# Patient Record
Sex: Male | Born: 1970 | Race: White | Hispanic: No | Marital: Married | Smoking: Never smoker
Health system: Southern US, Community
[De-identification: ages and names within clinical notes are randomized; demographics above are authoritative.]

## PROBLEM LIST (undated history)

## (undated) ENCOUNTER — Ambulatory Visit: Admission: EM | Payer: 59 | Source: Home / Self Care

## (undated) HISTORY — PX: SHOULDER ARTHROSCOPY: SHX128

---

## 2018-03-04 ENCOUNTER — Ambulatory Visit (INDEPENDENT_AMBULATORY_CARE_PROVIDER_SITE_OTHER): Payer: Self-pay | Admitting: Orthopaedic Surgery

## 2018-03-12 ENCOUNTER — Encounter (INDEPENDENT_AMBULATORY_CARE_PROVIDER_SITE_OTHER): Payer: Self-pay | Admitting: Orthopaedic Surgery

## 2018-03-12 ENCOUNTER — Ambulatory Visit (INDEPENDENT_AMBULATORY_CARE_PROVIDER_SITE_OTHER): Payer: Worker's Compensation | Admitting: Orthopaedic Surgery

## 2018-03-12 ENCOUNTER — Ambulatory Visit (INDEPENDENT_AMBULATORY_CARE_PROVIDER_SITE_OTHER): Payer: Self-pay

## 2018-03-12 VITALS — BP 133/97 | HR 89 | Ht 72.0 in | Wt 275.0 lb

## 2018-03-12 DIAGNOSIS — M25531 Pain in right wrist: Secondary | ICD-10-CM

## 2018-03-12 NOTE — Progress Notes (Signed)
   Office Visit Note   Patient: Russell Duarte.           Date of Birth: Dec 01, 1970           MRN: [id number] Visit Date: 03/12/2018              Requested by: No referring provider defined for this encounter. PCP: No primary care provider on file.   Assessment & Plan: Visit Diagnoses:  1. Pain in right wrist     Plan: Thumb spica splint right wrist.  MR arthrogram right wrist.  Return after the above study  Follow-Up Instructions: No follow-ups on file.   Orders:  Orders Placed This Encounter  Procedures  . XR Wrist Complete Left   No orders of the defined types were placed in this encounter.     Procedures: No procedures performed   Clinical Data: No additional findings.   Subjective: No chief complaint on file. On-the-job injury on August 16.  Larey Seat down an embankment and tried to break his fall with an outstretched right upper extremity.  Had some discomfort initially that he thought was simply a "sprain".  Seen approximately a week later in the [facility] emergency room with films demonstrating possible separation of the scapholunate articulation.  In a splint and asked to follow-up with his orthopedist.  Has been able to work in the splint but "awkward".  No related numbness or tingling. I did review the films on the PACS system.  There is a separation between the scaphoid and the lunate but no other obvious abnormalities.  HPI  Review of Systems   Objective: Vital Signs: There were no vitals taken for this visit.  Physical Exam  Constitutional: He is oriented to person, place, and time. He appears well-developed and well-nourished.  HENT:  Mouth/Throat: Oropharynx is clear and moist.  Eyes: Pupils are equal, round, and reactive to light. EOM are normal.  Pulmonary/Chest: Effort normal.  Neurological: He is alert and oriented to person, place, and time.  Skin: Skin is warm and dry.  Psychiatric: He has a normal mood and affect. His behavior is normal.     Ortho Exam splint right wrist was removed.  No ecchymosis.  No edema.  Neurovascular exam intact.  Able to make a full fist.  Does have some tenderness in the area of the flexor carpi radialis tendon at the level of the wrist and even the snuffbox.  Some tenderness in the area of the scapholunate articulation.  No pain or distal radius or ulna.  No pain in the dorsum of his wrist other than above.  No digit swelling.  Neurovascular exam intact Specialty Comments:  No specialty comments available.  Imaging: No results found.   PMFS History: There are no active problems to display for this patient.  No past medical history on file.  No family history on file.   Social History   Occupational History  . Not on file  Tobacco Use  . Smoking status: Not on file  Substance and Sexual Activity  . Alcohol use: Not on file  . Drug use: Not on file  . Sexual activity: Not on file     Valeria Batman, MD   Note - This record has been created using [employer].  Chart creation errors have been sought, but may not always  have been located. Such creation errors do not reflect on  the standard of medical care.

## 2018-03-27 ENCOUNTER — Telehealth (INDEPENDENT_AMBULATORY_CARE_PROVIDER_SITE_OTHER): Payer: Self-pay

## 2018-03-27 NOTE — Telephone Encounter (Signed)
fyi-I got notification from [facility] that pt is scheduled for MRI [facility] on 04/02/[facility]. Seen in [place] so will need f/u appt there. Thanks

## 2018-03-28 NOTE — Telephone Encounter (Signed)
Notified Russell Duarte in [place] to set up follow appt in [place]

## 2018-04-02 ENCOUNTER — Encounter: Payer: Self-pay | Admitting: Orthopaedic Surgery

## 2018-04-09 ENCOUNTER — Other Ambulatory Visit (INDEPENDENT_AMBULATORY_CARE_PROVIDER_SITE_OTHER): Payer: Self-pay | Admitting: Radiology

## 2018-04-09 ENCOUNTER — Encounter (INDEPENDENT_AMBULATORY_CARE_PROVIDER_SITE_OTHER): Payer: Self-pay | Admitting: Orthopaedic Surgery

## 2018-04-09 ENCOUNTER — Ambulatory Visit (INDEPENDENT_AMBULATORY_CARE_PROVIDER_SITE_OTHER): Payer: Worker's Compensation | Admitting: Orthopaedic Surgery

## 2018-04-09 DIAGNOSIS — M79641 Pain in right hand: Secondary | ICD-10-CM

## 2018-04-09 NOTE — Progress Notes (Signed)
   Office Visit Note   Patient: Russell Duarte.           Date of Birth: 1971/01/15           MRN: [id number] Visit Date: 04/09/2018              Requested by: No referring provider defined for this encounter. PCP: Patient, No Pcp Per   Assessment & Plan: Visit Diagnoses:  1. Pain of right hand     Plan: MRI arthrogram of right wrist demonstrates a tear of the scapholunate ligament allowing contrast to extend into the mid carpal row.  No additional significant internal derangement is identified. Russell Duarte and his wrist while on the job about 7 weeks ago.  He is been wearing a volar wrist splint but still having some pain.  Given all of the above I think it is worth having 1 of the hand surgeons evaluate him for consideration of surgical intervention.  We will make the referral after approval from [employer].  Follow-Up Instructions: Return will refer to Dr Merlyn Lot.   Orders:  No orders of the defined types were placed in this encounter.  No orders of the defined types were placed in this encounter.     Procedures: No procedures performed   Clinical Data: No additional findings.   Subjective: No chief complaint on file. 7-week history of right wrist pain as previously identified with an on-the-job injury.  MRI arthrogram obtained last week and here for the results.  HPI  Review of Systems   Objective: Vital Signs: There were no vitals taken for this visit.  Physical Exam  Ortho Exam limited exam of right wrist out of the splint.  Still having some tenderness dorsally and at the radiocarpal joint in the area of the scapholunate ligament.  Prior x-rays reveal widening.  Skin intact.  Neurovascular exam intact  Specialty Comments:  No specialty comments available.  Imaging: No results found.   PMFS History: There are no active problems to display for this patient.  History reviewed. No pertinent past medical history.  History reviewed. No pertinent  family history.  Past Surgical History:  Procedure Laterality Date  . SHOULDER ARTHROSCOPY     Social History   Occupational History  . Not on file  Tobacco Use  . Smoking status: Never Smoker  . Smokeless tobacco: Never Used  Substance and Sexual Activity  . Alcohol use: Not Currently  . Drug use: Not Currently  . Sexual activity: Not on file     Valeria Batman, MD   Note - This record has been created using [employer].  Chart creation errors have been sought, but may not always  have been located. Such creation errors do not reflect on  the standard of medical care.

## 2018-06-11 ENCOUNTER — Other Ambulatory Visit: Payer: Self-pay | Admitting: Orthopedic Surgery

## 2018-07-14 ENCOUNTER — Other Ambulatory Visit: Payer: Self-pay

## 2018-07-14 ENCOUNTER — Encounter (HOSPITAL_BASED_OUTPATIENT_CLINIC_OR_DEPARTMENT_OTHER): Payer: Self-pay | Admitting: *Deleted

## 2018-07-22 ENCOUNTER — Encounter (HOSPITAL_BASED_OUTPATIENT_CLINIC_OR_DEPARTMENT_OTHER)
Admission: RE | Disposition: A | Discharge status: Home / Self Care | Payer: Self-pay | Source: Ambulatory Visit | Attending: Orthopedic Surgery

## 2018-07-22 ENCOUNTER — Other Ambulatory Visit: Payer: Self-pay

## 2018-07-22 ENCOUNTER — Ambulatory Visit (HOSPITAL_BASED_OUTPATIENT_CLINIC_OR_DEPARTMENT_OTHER): Payer: No Typology Code available for payment source | Admitting: Anesthesiology

## 2018-07-22 ENCOUNTER — Encounter (HOSPITAL_BASED_OUTPATIENT_CLINIC_OR_DEPARTMENT_OTHER): Payer: Self-pay

## 2018-07-22 ENCOUNTER — Ambulatory Visit (HOSPITAL_BASED_OUTPATIENT_CLINIC_OR_DEPARTMENT_OTHER)
Admission: RE | Admit: 2018-07-22 | Discharge: 2018-07-22 | Disposition: A | Discharge status: Home / Self Care | Payer: No Typology Code available for payment source | Source: Ambulatory Visit | Attending: Orthopedic Surgery | Admitting: Orthopedic Surgery

## 2018-07-22 DIAGNOSIS — Y929 Unspecified place or not applicable: Secondary | ICD-10-CM | POA: Diagnosis not present

## 2018-07-22 DIAGNOSIS — W010XXA Fall on same level from slipping, tripping and stumbling without subsequent striking against object, initial encounter: Secondary | ICD-10-CM | POA: Insufficient documentation

## 2018-07-22 DIAGNOSIS — S63591A Other specified sprain of right wrist, initial encounter: Secondary | ICD-10-CM | POA: Insufficient documentation

## 2018-07-22 HISTORY — PX: WRIST ARTHROSCOPY: SHX838

## 2018-07-22 SURGERY — ARTHROSCOPY, WRIST
Anesthesia: Regional | Site: Wrist | Laterality: Right

## 2018-07-22 MED ORDER — ONDANSETRON HCL 4 MG/2ML IJ SOLN
INTRAMUSCULAR | Status: DC | PRN
  Administered 2018-07-22: 4 mg via INTRAVENOUS

## 2018-07-22 MED ORDER — FENTANYL CITRATE (PF) 100 MCG/2ML IJ SOLN
INTRAMUSCULAR | Status: AC
  Filled 2018-07-22: qty 2

## 2018-07-22 MED ORDER — SCOPOLAMINE 1 MG/3DAYS TD PT72: 1.0000 | MEDICATED_PATCH | Freq: Once | TRANSDERMAL | Status: DC | PRN

## 2018-07-22 MED ORDER — OXYCODONE HCL 5 MG/5ML PO SOLN: 5.0000 mg | Freq: Once | ORAL | Status: DC | PRN

## 2018-07-22 MED ORDER — OXYCODONE HCL 5 MG PO TABS: 5.0000 mg | ORAL_TABLET | Freq: Once | ORAL | Status: DC | PRN

## 2018-07-22 MED ORDER — MEPERIDINE HCL 25 MG/ML IJ SOLN: 6.2500 mg | INTRAMUSCULAR | Status: DC | PRN

## 2018-07-22 MED ORDER — MIDAZOLAM HCL 2 MG/2ML IJ SOLN
1.0000 mg | INTRAMUSCULAR | Status: DC | PRN
  Administered 2018-07-22: 2 mg via INTRAVENOUS

## 2018-07-22 MED ORDER — PROPOFOL 500 MG/50ML IV EMUL
INTRAVENOUS | Status: DC | PRN
  Administered 2018-07-22: 100 ug/kg/min via INTRAVENOUS

## 2018-07-22 MED ORDER — LACTATED RINGERS IV SOLN
INTRAVENOUS | Status: DC
  Administered 2018-07-22 (×2): via INTRAVENOUS

## 2018-07-22 MED ORDER — PROMETHAZINE HCL 25 MG/ML IJ SOLN: 6.2500 mg | INTRAMUSCULAR | Status: DC | PRN

## 2018-07-22 MED ORDER — CEFAZOLIN SODIUM-DEXTROSE 2-4 GM/100ML-% IV SOLN
2.0000 g | INTRAVENOUS | Status: AC
  Administered 2018-07-22: 2 g via INTRAVENOUS

## 2018-07-22 MED ORDER — CEFAZOLIN SODIUM-DEXTROSE 2-4 GM/100ML-% IV SOLN
INTRAVENOUS | Status: AC
  Filled 2018-07-22: qty 100

## 2018-07-22 MED ORDER — ROPIVACAINE HCL 7.5 MG/ML IJ SOLN
INTRAMUSCULAR | Status: DC | PRN
  Administered 2018-07-22: 40 mL via PERINEURAL

## 2018-07-22 MED ORDER — FENTANYL CITRATE (PF) 100 MCG/2ML IJ SOLN
50.0000 ug | INTRAMUSCULAR | Status: DC | PRN
  Administered 2018-07-22: 50 ug via INTRAVENOUS

## 2018-07-22 MED ORDER — ONDANSETRON HCL 4 MG/2ML IJ SOLN
INTRAMUSCULAR | Status: AC
  Filled 2018-07-22: qty 2

## 2018-07-22 MED ORDER — TRAMADOL HCL 50 MG PO TABS: 50.0000 mg | ORAL_TABLET | Freq: Four times a day (QID) | ORAL | 0 refills | Status: DC | PRN

## 2018-07-22 MED ORDER — MIDAZOLAM HCL 2 MG/2ML IJ SOLN
INTRAMUSCULAR | Status: AC
  Filled 2018-07-22: qty 2

## 2018-07-22 MED ORDER — FENTANYL CITRATE (PF) 100 MCG/2ML IJ SOLN: 25.0000 ug | INTRAMUSCULAR | Status: DC | PRN

## 2018-07-22 MED ORDER — CHLORHEXIDINE GLUCONATE 4 % EX LIQD: 60.0000 mL | Freq: Once | CUTANEOUS | Status: DC

## 2018-07-22 SURGICAL SUPPLY — 71 items
BLADE CUDA 2.0 (BLADE) IMPLANT
BLADE EAR TYMPAN 2.5 60D BEAV (BLADE) IMPLANT
BLADE MINI RND TIP GREEN BEAV (BLADE) IMPLANT
BLADE SURG 15 STRL LF DISP TIS (BLADE) ×1 IMPLANT
BLADE SURG 15 STRL SS (BLADE) ×2
BNDG CMPR 9X4 STRL LF SNTH (GAUZE/BANDAGES/DRESSINGS) ×1
BNDG COHESIVE 3X5 TAN STRL LF (GAUZE/BANDAGES/DRESSINGS) ×2 IMPLANT
BNDG ESMARK 4X9 LF (GAUZE/BANDAGES/DRESSINGS) ×1 IMPLANT
BNDG GAUZE ELAST 4 BULKY (GAUZE/BANDAGES/DRESSINGS) ×2 IMPLANT
BUR CUDA 2.9 (BURR) IMPLANT
BUR FULL RADIUS 2.0 (BURR) IMPLANT
BUR FULL RADIUS 2.9 (BURR) ×1 IMPLANT
BUR GATOR 2.9 (BURR) IMPLANT
BUR SPHERICAL 2.9 (BURR) IMPLANT
CANISTER SUCT 1200ML W/VALVE (MISCELLANEOUS) IMPLANT
CHLORAPREP W/TINT 26ML (MISCELLANEOUS) ×2 IMPLANT
CORD BIPOLAR FORCEPS 12FT (ELECTRODE) IMPLANT
COVER BACK TABLE 60X90IN (DRAPES) ×2 IMPLANT
COVER MAYO STAND STRL (DRAPES) ×2 IMPLANT
COVER WAND RF STERILE (DRAPES) IMPLANT
CUFF TOURNIQUET SINGLE 18IN (TOURNIQUET CUFF) IMPLANT
DRAPE EXTREMITY T 121X128X90 (DISPOSABLE) ×2 IMPLANT
DRAPE IMP U-DRAPE 54X76 (DRAPES) ×2 IMPLANT
DRAPE OEC MINIVIEW 54X84 (DRAPES) IMPLANT
DRAPE SURG 17X23 STRL (DRAPES) ×2 IMPLANT
ELECT SMALL JOINT 90D BASC (ELECTRODE) IMPLANT
GAUZE SPONGE 4X4 12PLY STRL (GAUZE/BANDAGES/DRESSINGS) ×2 IMPLANT
GAUZE XEROFORM 1X8 LF (GAUZE/BANDAGES/DRESSINGS) ×2 IMPLANT
GLOVE BIOGEL PI IND STRL 8.5 (GLOVE) ×1 IMPLANT
GLOVE BIOGEL PI INDICATOR 8.5 (GLOVE) ×1
GLOVE SURG ORTHO 8.0 STRL STRW (GLOVE) ×2 IMPLANT
GOWN STRL REUS W/ TWL LRG LVL3 (GOWN DISPOSABLE) ×1 IMPLANT
GOWN STRL REUS W/TWL LRG LVL3 (GOWN DISPOSABLE) ×2
GOWN STRL REUS W/TWL XL LVL3 (GOWN DISPOSABLE) ×2 IMPLANT
IV NS IRRIG 3000ML ARTHROMATIC (IV SOLUTION) ×2 IMPLANT
IV SET EXT 30 76VOL 4 MALE LL (IV SETS) ×2 IMPLANT
NDL EPIDURAL TUOHY 20GX3.5 (NEEDLE) IMPLANT
NDL SAFETY ECLIPSE 18X1.5 (NEEDLE) ×3 IMPLANT
NDL SPNL 18GX3.5 QUINCKE PK (NEEDLE) IMPLANT
NEEDLE HYPO 18GX1.5 SHARP (NEEDLE) ×4
NEEDLE HYPO 22GX1.5 SAFETY (NEEDLE) ×2 IMPLANT
NEEDLE SPNL 18GX3.5 QUINCKE PK (NEEDLE) IMPLANT
NEEDLE TUOHY 20GX3.5 (NEEDLE) IMPLANT
NS IRRIG 1000ML POUR BTL (IV SOLUTION) IMPLANT
PACK BASIN DAY SURGERY FS (CUSTOM PROCEDURE TRAY) ×2 IMPLANT
PAD CAST 3X4 CTTN HI CHSV (CAST SUPPLIES) ×1 IMPLANT
PADDING CAST ABS 3INX4YD NS (CAST SUPPLIES) ×1
PADDING CAST ABS 4INX4YD NS (CAST SUPPLIES) ×1
PADDING CAST ABS COTTON 3X4 (CAST SUPPLIES) ×1 IMPLANT
PADDING CAST ABS COTTON 4X4 ST (CAST SUPPLIES) ×1 IMPLANT
PADDING CAST COTTON 3X4 STRL (CAST SUPPLIES) ×2
ROUTER HOODED VORTEX 2.9MM (BLADE) IMPLANT
SET SM JOINT TUBING/CANN (CANNULA) IMPLANT
SLEEVE SCD COMPRESS KNEE MED (MISCELLANEOUS) IMPLANT
SPLINT PLASTER CAST XFAST 3X15 (CAST SUPPLIES) IMPLANT
SPLINT PLASTER XTRA FASTSET 3X (CAST SUPPLIES)
STOCKINETTE 4X48 STRL (DRAPES) ×2 IMPLANT
SUCTION FRAZIER HANDLE 10FR (MISCELLANEOUS)
SUCTION TUBE FRAZIER 10FR DISP (MISCELLANEOUS) IMPLANT
SUT MERSILENE 4 0 P 3 (SUTURE) IMPLANT
SUT PDS AB 2-0 CT2 27 (SUTURE) IMPLANT
SUT STEEL 4 0 (SUTURE) IMPLANT
SUT VIC AB 2-0 PS2 27 (SUTURE) IMPLANT
SUT VICRYL 4-0 PS2 18IN ABS (SUTURE) IMPLANT
SYR BULB 3OZ (MISCELLANEOUS) ×2 IMPLANT
SYR CONTROL 10ML LL (SYRINGE) ×2 IMPLANT
TUBE CONNECTING 20X1/4 (TUBING) ×1 IMPLANT
TUBING ARTHRO INFLOW-ONLY STRL (TUBING) IMPLANT
UNDERPAD 30X30 (UNDERPADS AND DIAPERS) ×2 IMPLANT
WAND SHORT BEVEL W/CORD (SURGICAL WAND) ×1 IMPLANT
WATER STERILE IRR 1000ML POUR (IV SOLUTION) ×2 IMPLANT

## 2018-07-22 NOTE — Brief Op Note (Signed)
07/22/2018  11:35 AM  PATIENT:  Kem Kays.  48 y.o. male  PRE-OPERATIVE DIAGNOSIS:  SCAPHO LUNATE TEAR RIGHT WRIST  POST-OPERATIVE DIAGNOSIS:  * No post-op diagnosis entered *  PROCEDURE:  Procedure(s) with comments: RIGHT ARTHROSCOPY WRIST DEBRIDEMENT POSSIBLE SHRINKAGE (Right) - AXILLLARY BLOCK  SURGEON:  Surgeon(s) and Role:    * Cindee Salt, MD - Primary  PHYSICIAN ASSISTANT:   ASSISTANTS: none   ANESTHESIA:   regional and IV sedation  EBL: 61ml  BLOOD ADMINISTERED:none  DRAINS: none   LOCAL MEDICATIONS USED:  NONE  SPECIMEN:  No Specimen  DISPOSITION OF SPECIMEN:  N/A  COUNTS:  YES  TOURNIQUET:   Total Tourniquet Time Documented: Upper Arm (Right) - 22 minutes Total: Upper Arm (Right) - 22 minutes   DICTATION: .Reubin Milan Dictation  PLAN OF CARE: Discharge to home after PACU  PATIENT DISPOSITION:  PACU - hemodynamically stable.

## 2018-07-22 NOTE — Discharge Instructions (Addendum)

## 2018-07-22 NOTE — Anesthesia Procedure Notes (Signed)
Anesthesia Regional Block: Axillary brachial plexus block   Pre-Anesthetic Checklist: ,, timeout performed, Correct Patient, Correct Site, Correct Laterality, Correct Procedure, Correct Position, site marked, Risks and benefits discussed,  Surgical consent,  Pre-op evaluation,  At surgeon's request and post-op pain management  Laterality: Right  Prep: chloraprep       Needles:  Injection technique: Single-shot  Needle Type: Stimiplex          Additional Needles:   Procedures:,,,, ultrasound used (permanent image in chart),,,,  Narrative:  Start time: 07/22/2018 10:27 AM End time: 07/22/2018 10:32 AM  Performed by: Personally  Anesthesiologist: Lewie Loron, MD  Additional Notes: Patient tolerated the procedure well without complications

## 2018-07-22 NOTE — H&P (Signed)
  Russell Duarte. is an 48 y.o. male.   Chief Complaint: right wrist painHPI: Russell Duarte is a 48 year old right-hand-dominant male referred by Dr. Cleophas Dunker for consultation regarding an injury he sustained to his wrist when he slipped and fell onto his outstretched hand. The injury occurred on February 17, 2018. He was originally told he might have a fractured scaphoid was subsequently referred to Dr. Cleophas Dunker who felt he may have no fracture and a ligament injury. He ordered an MRI arthrogram which was done revealing a tear of the scapholunate ligament of his right wrist. He complains of a toothache type pain especially with a cold or doing push-ups with a VAS score of 7-8 over 10. 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 Is been taking Aleve. He has no prior history of injury. He has no history of diabetes thyroid problems arthritis or gout. His family history is positive diabetes arthritis negative for thyroid problems and gout. We discussed the possibility of arthroscopic inspection versus exploration for possible repair of his scapholunate ligament injury right wrist. He has had an MRI done revealing a scapholunate ligament tear   History reviewed. No pertinent past medical history.  Past Surgical History:  Procedure Laterality Date  . SHOULDER ARTHROSCOPY      History reviewed. No pertinent family history. Social History:  reports that he has never smoked. He has never used smokeless tobacco. He reports that he does not drink alcohol or use drugs.  Allergies: No Known Allergies  No medications prior to admission.    No results found for this or any previous visit (from the past 48 hour(s)).  No results found.   Pertinent items are noted in HPI.  Height 6' (1.829 m), weight 124.7 kg.  General appearance: alert, cooperative and appears stated age Head: Normocephalic, without obvious abnormality Neck: no JVD Resp: clear to  auscultation bilaterally Cardio: regular rate and rhythm, S1, S2 normal, no murmur, click, rub or gallop GI: soft, non-tender; bowel sounds normal; no masses,  no organomegaly Extremities: right wrist pain Pulses: 2+ and symmetric Skin: Skin color, texture, turgor normal. No rashes or lesions Neurologic: Grossly normal Incision/Wound: na  Assessment/Plan  Assessment:  1. Tear of right scapholunate ligament  Plan: He has decided he would like to proceed proceed with arthroscopic inspection debridement possible shrinkage of possible in the effort to evaluate the total extent of the tear injury. This will be scheduled as an outpatient under regional anesthesia anesthesia. He is advised to that this may not resolve all of his symptoms for him. But will give [place] a better idea of what and we may need to be done more definitively if there is a complete tear. It would be up to him to decide whether he would like to have this surgically reconstructed. I will see him back following the surgery. This to his right wrist which can be done as an outpatient under regional anesthesia. His one-handed work  Cindee Salt 07/22/2018, 9:58 AM

## 2018-07-22 NOTE — Progress Notes (Signed)
Assisted Dr. Renold Don with right, interscalene , axillary block. Side rails up, monitors on throughout procedure. See vital signs in flow sheet. Tolerated Procedure well.

## 2018-07-22 NOTE — Transfer of Care (Signed)
Immediate Anesthesia Transfer of Care Note  Patient: Russell Duarte.  Procedure(s) Performed: RIGHT ARTHROSCOPY WRIST DEBRIDEMENT POSSIBLE SHRINKAGE (Right Wrist)  Patient Location: PACU  Anesthesia Type:MAC combined with regional for post-op pain  Level of Consciousness: awake, alert  and oriented  Airway & Oxygen Therapy: Patient Spontanous Breathing and Patient connected to face mask oxygen  Post-op Assessment: Report given to RN and Post -op Vital signs reviewed and stable  Post vital signs: Reviewed and stable  Last Vitals:  Vitals Value Taken Time  BP    Temp    Pulse 82 07/22/2018 11:38 AM  Resp 15 07/22/2018 11:38 AM  SpO2 95 % 07/22/2018 11:38 AM  Vitals shown include unvalidated device data.  Last Pain:  Vitals:   07/22/18 1000  TempSrc: Oral  PainSc: 3       Patients Stated Pain Goal: 3 (54/65/68 1275)  Complications: No apparent anesthesia complications

## 2018-07-22 NOTE — Anesthesia Preprocedure Evaluation (Signed)
Anesthesia Evaluation  Patient identified by MRN, date of birth, ID band Patient awake    Reviewed: Allergy & Precautions, NPO status , Patient's Chart, lab work & pertinent test results  Airway Mallampati: II  TM Distance: >3 FB Neck ROM: Full    Dental no notable dental hx.    Pulmonary neg pulmonary ROS,    Pulmonary exam normal breath sounds clear to auscultation       Cardiovascular negative cardio ROS Normal cardiovascular exam Rhythm:Regular Rate:Normal     Neuro/Psych negative neurological ROS  negative psych ROS   GI/Hepatic negative GI ROS, Neg liver ROS,   Endo/Other  negative endocrine ROS  Renal/GU negative Renal ROS     Musculoskeletal negative musculoskeletal ROS (+)   Abdominal (+) + obese,   Peds  Hematology negative hematology ROS (+)   Anesthesia Other Findings   Reproductive/Obstetrics                             Anesthesia Physical Anesthesia Plan  ASA: II  Anesthesia Plan: Regional   Post-op Pain Management:    Induction: Intravenous  PONV Risk Score and Plan: 1 and Treatment may vary due to age or medical condition, Propofol infusion and Ondansetron  Airway Management Planned: Simple Face Mask  Additional Equipment: None  Intra-op Plan:   Post-operative Plan:   Informed Consent: I have reviewed the patients History and Physical, chart, labs and discussed the procedure including the risks, benefits and alternatives for the proposed anesthesia with the patient or authorized representative who has indicated his/her understanding and acceptance.     Dental advisory given  Plan Discussed with: CRNA  Anesthesia Plan Comments:         Anesthesia Quick Evaluation

## 2018-07-22 NOTE — Op Note (Signed)
NAME: Baron Steinback. MEDICAL RECORD NO: [id number] DATE OF BIRTH: December 12, 1970 FACILITY: [facility] LOCATION: [facility] PHYSICIAN: Nicki Reaper, MD   OPERATIVE REPORT   DATE OF PROCEDURE: 07/22/18    PREOPERATIVE DIAGNOSIS:   Scapholunate ligament tear right wrist   POSTOPERATIVE DIAGNOSIS:   Lunotriquetral tear right wrist   PROCEDURE:   Arthroscopy with debridement of lunotriquetral tear partial synovectomy right wrist   SURGEON: Cindee Salt, M.D.   ASSISTANT: none   ANESTHESIA:  Regional with sedation   INTRAVENOUS FLUIDS:  Per anesthesia flow sheet.   ESTIMATED BLOOD LOSS:  Minimal.   COMPLICATIONS:  None.   SPECIMENS:  none   TOURNIQUET TIME:    Total Tourniquet Time Documented: Upper Arm (Right) - 22 minutes Total: Upper Arm (Right) - 22 minutes    DISPOSITION:  Stable to PACU.   INDICATIONS: Patient is a 48 year old male sustained an injury to his right wrist.  He has had wrist pain since that time MRI reveals a probable scapholunate ligament tear.  He has widening of the scapholunate ligament on x-ray.  He has elected to go under arthroscopic inspection in an effort to determine reconstruction alternatives.  Pre-peri-and postoperative course are discussed along with risks and complications.  He is aware that there is no guarantee to the surgery the possibility of infection recurrence injury to arteries nerves tendons complete relief symptoms dystrophy.  In preoperative area the patient is seen extremity marked by both patient and surgeon antibiotic given  OPERATIVE COURSE: Patient is brought to the operating room after supraclavicular block was carried out without difficulty in the preoperative area under the direction the anesthesia department.  Was prepped using ChloraPrep in the supine position with the right arm free.  A three-minute dry time was allowed timeout taken to confirm patient procedure.  The limb was placed in the Arthrex arc  arthroscopy tower 10 pounds traction applied.  The joint was inflated to the 3-4 portal a transverse incision made deepened with a hemostat blunt trocar used to enter the joint.  The joint was inspected volar radiocarpal ligaments were intact the scapholunate ligament appeared intact with a very minimal perforation.  The cartilage showed no significant changes on both the radial scaphoid and lunate facet and proximal aspect of the scaphoid and lunate.  The scope was swept ulnarly.  The triangular fibrocartilage complex was intact a irrigation catheter was placed in 6 you a 4-5 portal opened after localization with a 22-gauge needle transverse incision made deepened the hemostat below current blunt trocar used to enter the joint.  The TFCC had a normal trampoline effect but a large tear of the lunotriquetral ligament was immediately apparent with portions of the ligament floating in the joint.  Full-radius shaver and suction basket forceps were then introduced and the collateral ligament tear was debrided.  An ArthroWand was used to coagulate of the synovitis present.  The midcarpal joint was then inspected the instability of the lunotriquetral joint was noted there were no significant changes on the articular surface of either the distal portion of the proximal or proximal portion of the distal row instability was noted at the lunotriquetral joint.  Minimal instability was noted at the scapholunate ligament complex.  The instruments were removed.  The portals closed interrupted 4-0 nylon sutures.  A sterile compressive dressing and volar splint was applied.  The tourniquet was inflated through the middle of the procedure due to the amount of bleeding with the debridement of the  synovitis using the shaver the ArthroWand was used to minimize this.  A sterile compressive dressing splint was applied.  The patient tolerated the procedure well was taken to the recovery room for observation after in satisfactory condition  on deflation of the tourniquet all fingers immediately pink.  He will be discharged [facility] on Tylenol with ibuprofen and Ultram for backup.   Cindee SaltGary Rahm Minix, MD Electronically signed, 07/22/18

## 2018-07-23 ENCOUNTER — Encounter (HOSPITAL_BASED_OUTPATIENT_CLINIC_OR_DEPARTMENT_OTHER): Payer: Self-pay | Admitting: Orthopedic Surgery

## 2018-07-23 NOTE — Anesthesia Postprocedure Evaluation (Signed)
Anesthesia Post Note  Patient: Russell Duarte.  Procedure(s) Performed: RIGHT ARTHROSCOPY WRIST DEBRIDEMENT SHRINKAGE (Right Wrist)     Patient location during evaluation: PACU Anesthesia Type: Regional Level of consciousness: awake and alert Pain management: pain level controlled Vital Signs Assessment: post-procedure vital signs reviewed and stable Respiratory status: spontaneous breathing Cardiovascular status: stable Anesthetic complications: no    Last Vitals:  Vitals:   07/22/18 1200 07/22/18 1218  BP: (!) 148/96 (!) 142/98  Pulse:  73  Resp: 18 20  Temp:  36.4 C  SpO2: 94% 98%    Last Pain:  Vitals:   07/23/18 0851  TempSrc:   PainSc: 1                  Lewie Loron

## 2019-11-05 ENCOUNTER — Telehealth: Payer: Self-pay | Admitting: Pediatrics

## 2019-11-05 NOTE — Telephone Encounter (Signed)
Error

## 2021-08-10 DIAGNOSIS — M79651 Pain in right thigh: Secondary | ICD-10-CM | POA: Diagnosis not present

## 2021-08-10 DIAGNOSIS — R030 Elevated blood-pressure reading, without diagnosis of hypertension: Secondary | ICD-10-CM | POA: Diagnosis not present

## 2021-08-10 DIAGNOSIS — M5431 Sciatica, right side: Secondary | ICD-10-CM | POA: Diagnosis not present

## 2021-08-10 DIAGNOSIS — M25559 Pain in unspecified hip: Secondary | ICD-10-CM | POA: Diagnosis not present

## 2021-10-10 ENCOUNTER — Encounter (HOSPITAL_COMMUNITY): Payer: Self-pay | Admitting: *Deleted

## 2021-10-10 ENCOUNTER — Emergency Department (HOSPITAL_COMMUNITY): Payer: 59

## 2021-10-10 ENCOUNTER — Emergency Department (HOSPITAL_COMMUNITY)
Admission: EM | Admit: 2021-10-10 | Discharge: 2021-10-10 | Disposition: A | Discharge status: Home / Self Care | Payer: 59 | Attending: Emergency Medicine | Admitting: Emergency Medicine

## 2021-10-10 DIAGNOSIS — M25551 Pain in right hip: Secondary | ICD-10-CM | POA: Insufficient documentation

## 2021-10-10 LAB — CBC WITH DIFFERENTIAL/PLATELET
Abs Immature Granulocytes: 0.03 10*3/uL (ref 0.00–0.07)
Basophils Absolute: 0.1 10*3/uL (ref 0.0–0.1)
Basophils Relative: 1 %
Eosinophils Absolute: 0.2 10*3/uL (ref 0.0–0.5)
Eosinophils Relative: 2 %
HCT: 48.2 % (ref 39.0–52.0)
Hemoglobin: 16 g/dL (ref 13.0–17.0)
Immature Granulocytes: 0 %
Lymphocytes Relative: 20 %
Lymphs Abs: 1.8 10*3/uL (ref 0.7–4.0)
MCH: 29.8 pg (ref 26.0–34.0)
MCHC: 33.2 g/dL (ref 30.0–36.0)
MCV: 89.8 fL (ref 80.0–100.0)
Monocytes Absolute: 0.6 10*3/uL (ref 0.1–1.0)
Monocytes Relative: 7 %
Neutro Abs: 6.3 10*3/uL (ref 1.7–7.7)
Neutrophils Relative %: 70 %
Platelets: 280 10*3/uL (ref 150–400)
RBC: 5.37 MIL/uL (ref 4.22–5.81)
RDW: 15.3 % (ref 11.5–15.5)
WBC: 8.9 10*3/uL (ref 4.0–10.5)
nRBC: 0 % (ref 0.0–0.2)

## 2021-10-10 LAB — BASIC METABOLIC PANEL
Anion gap: 10 (ref 5–15)
BUN: 11 mg/dL (ref 6–20)
CO2: 25 mmol/L (ref 22–32)
Calcium: 9.1 mg/dL (ref 8.9–10.3)
Chloride: 103 mmol/L (ref 98–111)
Creatinine, Ser: 0.85 mg/dL (ref 0.61–1.24)
GFR, Estimated: >60 mL/min (ref >60)
Glucose, Bld: 101 mg/dL — ABNORMAL HIGH (ref 70–99)
Potassium: 4.1 mmol/L (ref 3.5–5.1)
Sodium: 138 mmol/L (ref 135–145)

## 2021-10-10 MED ORDER — DIAZEPAM 2 MG PO TABS: 2.0000 mg | ORAL_TABLET | Freq: Three times a day (TID) | ORAL | 0 refills | Status: DC | PRN

## 2021-10-10 MED ORDER — OXYCODONE-ACETAMINOPHEN 5-325 MG PO TABS: 1.0000 | ORAL_TABLET | ORAL | 0 refills | Status: DC | PRN

## 2021-10-10 MED ORDER — HYDROMORPHONE HCL 1 MG/ML IJ SOLN
1.0000 mg | Freq: Once | INTRAMUSCULAR | Status: AC
  Administered 2021-10-10: 1 mg via INTRAVENOUS
  Filled 2021-10-10: qty 1

## 2021-10-10 MED ORDER — DIAZEPAM 5 MG/ML IJ SOLN
2.5000 mg | Freq: Once | INTRAMUSCULAR | Status: AC
  Administered 2021-10-10: 2.5 mg via INTRAVENOUS
  Filled 2021-10-10: qty 2

## 2021-10-10 MED ORDER — ONDANSETRON HCL 4 MG/2ML IJ SOLN
4.0000 mg | Freq: Once | INTRAMUSCULAR | Status: AC
  Administered 2021-10-10: 4 mg via INTRAVENOUS
  Filled 2021-10-10: qty 2

## 2021-10-10 MED ORDER — IOHEXOL 300 MG/ML  SOLN
100.0000 mL | Freq: Once | INTRAMUSCULAR | Status: AC | PRN
  Administered 2021-10-10: 80 mL via INTRAVENOUS

## 2021-10-10 MED ORDER — OXYCODONE-ACETAMINOPHEN 5-325 MG PO TABS
2.0000 | ORAL_TABLET | Freq: Once | ORAL | Status: AC
  Administered 2021-10-10: 2 via ORAL
  Filled 2021-10-10: qty 2

## 2021-10-10 NOTE — Discharge Instructions (Signed)
Take the medicines prescribed for pain and muscle spasm. You may continue application of ice as tolerated.  Call Dr. Jena Gauss for further evaluation of your pain if not improving with this treatment plan.  Do not drive within 4 hours of taking the medicines prescribed as they will make you drowsy.  ?

## 2021-10-10 NOTE — ED Triage Notes (Signed)
Pain in right hip x 1 week ?

## 2021-10-10 NOTE — ED Provider Notes (Signed)
?[facility] DEPARTMENT ?Provider Note ? ? ?CSN: [id number] ?Arrival date & time: 10/10/21  1306 ? ?  ? ?History ? ?Chief Complaint  ?Patient presents with  ? Hip Pain  ? ? ?Russell Duarte is a 51 y.o. male presenting for evaluation of severe pain in his right hip which has been present for 1 week.  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  He states he had some mild soreness in his right anterior hip region that evening but the severe pain did not start until the next day.  He describes a sharp stabbing pain in the right hip and upper quadricep muscles and states episodes of near muscle spasm at the site which is worsened with movement.  He states simply rolling over in bed causes severe pain at the site.  He denies scrotal pain or swelling.  There is no radiation of pain into his lower leg.  He has Flexeril and ibuprofen at home for intermittent episodes of sciatica which have not relieved this pain.  He also has tramadol from his last episode of kidney stone passage which also was not helpful at relieving this pain.  He has been unable to weight bear without severe pain as well.  He denies fevers or chills, no history of prior similar symptoms. ? ?The history is provided by the patient and the spouse.  ? ?  ? ?Home Medications ?Prior to Admission medications   ?Medication Sig Start Date End Date Taking? Authorizing Provider  ?diazepam (VALIUM) 2 MG tablet Take 1 tablet (2 mg total) by mouth every 8 (eight) hours as needed for muscle spasms. 10/10/21  Yes Taos Tapp, Raynelle FanningJulie, PA-C  ?oxyCODONE-acetaminophen (PERCOCET/ROXICET) 5-325 MG tablet Take 1 tablet by mouth every 4 (four) hours as needed. 10/10/21  Yes Lagretta Loseke, Raynelle FanningJulie, PA-C  ?traMADol (ULTRAM) 50 MG tablet Take 1 tablet (50 mg total) by mouth every 6 (six) hours as needed. 07/22/18   Cindee SaltKuzma, Gary, MD  ?   ? ?Allergies    ?Patient has no known allergies.    ? ?Review of Systems   ?Review of Systems  ?Constitutional:  Negative for chills and fever.  ?Musculoskeletal:  Positive for arthralgias. Negative for joint swelling and myalgias.  ?Skin: Negative.  Negative for rash.  ?Neurological:  Negative for weakness and numbness.  ? ?Physical Exam ?Updated Vital Signs ?BP (!) 171/115   Pulse (!) 103   Temp 97.8 ?F (36.6 ?C) (Oral)   Resp 19   Ht 6' (1.829 m)   Wt 129.3 kg   SpO2 94%   BMI 38.65 kg/m?  ?Physical Exam ?Exam conducted with a chaperone present.  ?Constitutional:   ?   Appearance: He is well-developed.  ?HENT:  ?   Head: Atraumatic.  ?Cardiovascular:  ?   Pulses:     ?     Dorsalis pedis pulses are 2+ on the right side and 2+ on the left side.  ?   Comments: Pulses equal bilaterally ?Genitourinary: ?   Testes:     ?   Right: Mass, tenderness or swelling not present.  ?Musculoskeletal:     ?   General: Tenderness present. No swelling or deformity.  ?   Cervical back: Normal range of motion.  ?   Right hip: No crepitus. Normal strength.  ?   Right upper leg: Tenderness present. No swelling, edema or  deformity.  ?   Comments: Tenderness to palpation right anterior proximal upper thigh,  no edema or erythema.  No palpable deformity and no ttp right greater trochanter/bursa region.    ?Skin: ?   General: Skin is warm and dry.  ?Neurological:  ?   Mental Status: He is alert.  ?   Sensory: No sensory deficit.  ?   Motor: No weakness.  ?   Deep Tendon Reflexes: Reflexes normal.  ? ? ?ED Results / Procedures / Treatments   ?Labs ?(all labs ordered are listed, but only abnormal results are displayed) ?Labs Reviewed  ?BASIC METABOLIC PANEL - Abnormal; Notable for the following components:  ?    Result Value  ? Glucose, Bld 101 (*)   ? All other components within normal limits  ?CBC WITH DIFFERENTIAL/PLATELET  ? ? ?EKG ?None ? ?Radiology ?CT HIP RIGHT W CONTRAST ? ?Result Date: 10/10/2021 ?CLINICAL DATA:  Hip pain, osteonecrosis suspected. X-ray done. Right hip pain  for 1 week. EXAM: CT OF THE LOWER RIGHT EXTREMITY WITH CONTRAST TECHNIQUE: Multidetector CT imaging of the lower right extremity was performed according to the standard protocol following intravenous contrast administration. RADIATION DOSE REDUCTION: This exam was performed according to the departmental dose-optimization program which includes automated exposure control, adjustment of the mA and/or kV according to patient size and/or use of iterative reconstruction technique. CONTRAST:  54mL OMNIPAQUE IOHEXOL 300 MG/ML  SOLN COMPARISON:  Radiograph performed earlier on the same date. FINDINGS: Bones/Joint/Cartilage No fracture or dislocation. Normal alignment. No joint effusion. No evidence of osteonecrosis. Ligaments Ligaments are suboptimally evaluated by CT. Muscles and Tendons Muscles are normal in bulk. No intramuscular hematoma or fluid collection. Soft tissue No fluid collection or hematoma. No soft tissue mass. Skin and subcutaneous soft tissues are within normal limits. IMPRESSION: 1. No evidence of fracture, osteonecrosis or joint effusion. No significant arthropathy. 2. Muscles and subcutaneous soft tissues are unremarkable. No evidence of hematoma. Electronically Signed   By: Larose Hires D.O.   On: 10/10/2021 19:35  ? ?DG Hip Unilat  With Pelvis 2-3 Views Right ? ?Result Date: 10/10/2021 ?CLINICAL DATA:  Right hip pain 1 week EXAM: DG HIP (WITH OR WITHOUT PELVIS) 2-3V RIGHT COMPARISON:  None. FINDINGS: There is no evidence of hip fracture or dislocation. There is no evidence of arthropathy or other focal bone abnormality. IMPRESSION: Negative. Electronically Signed   By: Marlan Palau M.D.   On: 10/10/2021 14:12   ? ?Procedures ?Procedures  ? ? ?Medications Ordered in ED ?Medications  ?HYDROmorphone (DILAUDID) injection 1 mg (1 mg Intravenous Given 10/10/21 1729)  ?ondansetron [facility]) injection 4 mg (4 mg Intravenous Given 10/10/21 1728)  ?iohexol (OMNIPAQUE) 300 MG/ML solution 100 mL (80 mLs Intravenous  Contrast Given 10/10/21 1907)  ?diazepam (VALIUM) injection 2.5 mg (2.5 mg Intravenous Given 10/10/21 2025)  ?oxyCODONE-acetaminophen (PERCOCET/ROXICET) 5-325 MG per tablet 2 tablet (2 tablets Oral Given 10/10/21 2025)  ? ? ?ED Course/ Medical Decision Making/ A&P ?  ?                        ?Medical Decision Making ?Patient with significant pain in his right upper thigh which appears to be muscular in origin, possible deep quadricep strain or tear. Other possibilities including joint infection, less likely as afebrile and no risk factors for this.   He does have pain out of proportion to the findings however, raising concern for early necrotizing fasciitis.  WBC count normal, afebrile.  Plain film imaging is negative for acute findings, proceeded to CT for further definition, negative for effusion, fluid collections, inflammatory changes.  Labs reviewed and are stable.  He was given IV Dilaudid and had significant improvement in his pain symptoms, which worsened when he was asked to move for his CT imaging.  He describes a muscle spasm-like quality to his anterior upper thigh.  Valium IV has been ordered, Percocet given.  Patient was offered MRI imaging, although he would need to be transferred to [facility] to have this completed.  He defers this test at this time.  He will probably need close follow-up with orthopedics, crutches ordered.  We will plan dispo home if Valium and Percocet improve his symptoms. ? ?Amount and/or Complexity of Data Reviewed ?Labs: ordered. ?   Details: Patient has a normal WBC count, joint infection felt unlikely, no risk factors for this. ?Radiology: ordered. ?   Details: Plain film negative, CT is also negative for hematoma, no fluid collections.  No joint effusion. ? ?Risk ?Prescription drug management. ?Decision regarding hospitalization. ?Risk Details: Pt offered MRI (would require transfer to [facility] for this), pt deferred at this time. ? ? ? ? ? ? ? ? ? ? ?Final Clinical Impression(s) / ED  Diagnoses ?Final diagnoses:  ?Right hip pain  ? ? ?Rx / DC Orders ?ED Discharge Orders   ? ?      Ordered  ?  diazepam (VALIUM) 2 MG tablet  Every 8 hours PRN       ? 10/10/21 2029  ?  oxyCODONE-acetaminophen (PER

## 2021-10-10 NOTE — ED Provider Notes (Signed)
I assumed pt care at 7:45 pm.  Pt to be discharged after receiving Medication and crutches.  Pt received valium and oxycodone.   ?  ?Elson Areas, PA-C ?10/10/21 2114 ? ?  ?Maia Plan, MD ?10/11/21 1210 ? ?

## 2021-10-23 DIAGNOSIS — Z6837 Body mass index (BMI) 37.0-37.9, adult: Secondary | ICD-10-CM | POA: Diagnosis not present

## 2021-10-23 DIAGNOSIS — M5416 Radiculopathy, lumbar region: Secondary | ICD-10-CM | POA: Diagnosis not present

## 2022-03-23 DIAGNOSIS — R030 Elevated blood-pressure reading, without diagnosis of hypertension: Secondary | ICD-10-CM | POA: Diagnosis not present

## 2022-03-23 DIAGNOSIS — M5441 Lumbago with sciatica, right side: Secondary | ICD-10-CM | POA: Diagnosis not present

## 2022-04-18 DIAGNOSIS — M5441 Lumbago with sciatica, right side: Secondary | ICD-10-CM | POA: Diagnosis not present

## 2022-04-18 DIAGNOSIS — M545 Low back pain, unspecified: Secondary | ICD-10-CM | POA: Diagnosis not present

## 2022-04-28 DIAGNOSIS — M545 Low back pain, unspecified: Secondary | ICD-10-CM | POA: Diagnosis not present

## 2022-05-01 DIAGNOSIS — M5442 Lumbago with sciatica, left side: Secondary | ICD-10-CM | POA: Diagnosis not present

## 2022-05-01 DIAGNOSIS — M5441 Lumbago with sciatica, right side: Secondary | ICD-10-CM | POA: Diagnosis not present

## 2022-05-02 DIAGNOSIS — M5416 Radiculopathy, lumbar region: Secondary | ICD-10-CM | POA: Diagnosis not present

## 2022-05-23 ENCOUNTER — Telehealth: Payer: Self-pay | Admitting: Cardiovascular Disease

## 2022-05-23 NOTE — Telephone Encounter (Signed)
Called patient regarding his NP appt with Dr. Allyson Sabal tomorrow. Patient was advised that he needed to drop off Urgent Care notes today for him to be seen tomorrow. He was advised that if the notes were not dropped off by 5pm then the appointment would be cancelled and he would need to go to the ED for further evaluation and have the ED refer him to our office. Patient understood.

## 2022-05-24 ENCOUNTER — Ambulatory Visit: Payer: 59 | Attending: Cardiovascular Disease | Admitting: Cardiovascular Disease

## 2022-05-24 ENCOUNTER — Encounter: Payer: Self-pay | Admitting: Cardiovascular Disease

## 2022-05-24 VITALS — BP 162/100 | HR 89 | Ht 72.0 in | Wt 274.4 lb

## 2022-05-24 DIAGNOSIS — Z8249 Family history of ischemic heart disease and other diseases of the circulatory system: Secondary | ICD-10-CM | POA: Insufficient documentation

## 2022-05-24 DIAGNOSIS — G4733 Obstructive sleep apnea (adult) (pediatric): Secondary | ICD-10-CM | POA: Diagnosis not present

## 2022-05-24 DIAGNOSIS — I10 Essential (primary) hypertension: Secondary | ICD-10-CM | POA: Diagnosis not present

## 2022-05-24 MED ORDER — LISINOPRIL-HYDROCHLOROTHIAZIDE 20-12.5 MG PO TABS: 1.0000 | ORAL_TABLET | Freq: Every day | ORAL | 1 refills | Status: DC

## 2022-05-24 NOTE — Assessment & Plan Note (Signed)
Father had CAD.  I am going to get a coronary calcium score to further evaluate.

## 2022-05-24 NOTE — Assessment & Plan Note (Signed)
Patient has nocturnal snoring and daytime somnolence.  At some point in near future we will address this by obtaining a sleep study.

## 2022-05-24 NOTE — Assessment & Plan Note (Signed)
History of essential hypertension with blood pressure measured today at 162/100.  He is on lisinopril hydrochlorothiazide.  He is in a lot of pain because of herniated disks probably contributing to his hypertension.  Unfortunately, this is preventing him from moving forward with steroid injection for pain relief.  I am going to have him keep a 30-day blood pressure log and see a Pharm.D. in 2 weeks to review his readings and adjust his medications appropriately.

## 2022-05-24 NOTE — Progress Notes (Signed)
05/24/2022 Russell Duarte   1971-03-23  [id number]  Primary Physician Patient, No Pcp Per Primary Cardiologist: Runell Gess MD Nicholes Calamity, [place]  HPI:  Russell Duarte is a 51 y.o. moderately overweight married Caucasian male father of 3 who is accompanied by his wife Russell Duarte.  He is a Programmer, multimedia.  He was referred by the urgent care clinic because of recently diagnosed hypertension.  He apparently has had a very stressful job and for a long time was fairly physical.  He had back problems in the past and over the last several months he has had recurrent back pain from herniated disks.  There is no history of diabetes or hyperlipidemia.  His father did have CAD.  Is never had a heart attack or stroke.  He denies chest pain or shortness of breath.  Does have symptoms compatible obstructive sleep apnea.  He apparently is lost 35 pounds over the last several months because of testosterone replacement therapy.  He went to see his orthopedic surgeon yesterday was found to be significantly hypertensive and was seen in urgent care as a result of that who prescribed lisinopril hydrochlorothiazide.   Current Meds  Medication Sig   gabapentin (NEURONTIN) 300 MG capsule Take 300 mg by mouth.   lisinopril-hydrochlorothiazide (ZESTORETIC) 20-12.5 MG tablet Take 1 tablet by mouth daily.   meloxicam (MOBIC) 15 MG tablet Take 15 mg by mouth daily.   ondansetron (ZOFRAN) 4 MG tablet Take 4 mg by mouth every 6 (six) hours as needed.   oxyCODONE-acetaminophen (PERCOCET/ROXICET) 5-325 MG tablet Take 1 tablet by mouth every 4 (four) hours as needed.   traMADol HCl 100 MG TABS Take 1 tablet by mouth 4 (four) times daily.     No Known Allergies  Social History   Socioeconomic History   Marital status: Married    Spouse name: Not on file   Number of children: Not on file   Years of education: Not on file   Highest education level: Not on file  Occupational History   Not on file   Tobacco Use   Smoking status: Never   Smokeless tobacco: Never  Substance and Sexual Activity   Alcohol use: Yes   Drug use: Never   Sexual activity: Not on file  Other Topics Concern   Not on file  Social History Narrative   Not on file   Social Determinants of Health   Financial Resource Strain: Not on file  Food Insecurity: Not on file  Transportation Needs: Not on file  Physical Activity: Not on file  Stress: Not on file  Social Connections: Not on file  Intimate Partner Violence: Not on file     Review of Systems: General: negative for chills, fever, night sweats or weight changes.  Cardiovascular: negative for chest pain, dyspnea on exertion, edema, orthopnea, palpitations, paroxysmal nocturnal dyspnea or shortness of breath Dermatological: negative for rash Respiratory: negative for cough or wheezing Urologic: negative for hematuria Abdominal: negative for nausea, vomiting, diarrhea, bright red blood per rectum, melena, or hematemesis Neurologic: negative for visual changes, syncope, or dizziness All other systems reviewed and are otherwise negative except as noted above.    Blood pressure (!) 162/100, pulse 89, height 6' (1.829 m), weight 274 lb 6.4 oz (124.5 kg), SpO2 97 %.  General appearance: alert and no distress Neck: no adenopathy, no carotid bruit, no JVD, supple, symmetrical, trachea midline, and thyroid not enlarged, symmetric, no tenderness/mass/nodules Lungs: clear to auscultation bilaterally Heart: regular  rate and rhythm, S1, S2 normal, no murmur, click, rub or gallop Extremities: extremities normal, atraumatic, no cyanosis or edema Pulses: 2+ and symmetric Skin: Skin color, texture, turgor normal. No rashes or lesions Neurologic: Grossly normal  EKG sinus rhythm at 89 with borderline voltage for LVH.  Personally reviewed this EKG.  ASSESSMENT AND PLAN:   Essential hypertension History of essential hypertension with blood pressure measured  today at 162/100.  He is on lisinopril hydrochlorothiazide.  He is in a lot of pain because of herniated disks probably contributing to his hypertension.  Unfortunately, this is preventing him from moving forward with steroid injection for pain relief.  I am going to have him keep a 30-day blood pressure log and see a Pharm.D. in 2 weeks to review his readings and adjust his medications appropriately.  Sleep study Patient has nocturnal snoring and daytime somnolence.  At some point in near future we will address this by obtaining a sleep study.  Family history of heart disease Father had CAD.  I am going to get a coronary calcium score to further evaluate.     Runell Gess MD FACP,FACC,FAHA, [facility] 05/24/2022 10:39 AM

## 2022-05-24 NOTE — Patient Instructions (Addendum)
Medication Instructions:  Your physician recommends that you continue on your current medications as directed. Please refer to the Current Medication list given to you today.  *If you need a refill on your cardiac medications before your next appointment, please call your pharmacy*   Lab Work: Your physician recommends that you return for lab work in: 7-10 day for FASTING Lipid/liver panel & BMET   If you have labs (blood work) drawn today and your tests are completely normal, you will receive your results only by: MyChart Message (if you have MyChart) OR A paper copy in the mail If you have any lab test that is abnormal or we need to change your treatment, we will call you to review the results.   Testing/Procedures: Dr. Allyson Sabal has ordered a CT coronary calcium score.   Test location:   [facility]   This is $99 out of pocket.   Coronary CalciumScan A coronary calcium scan is an imaging test used to look for deposits of calcium and other fatty materials (plaques) in the inner lining of the blood vessels of the heart (coronary arteries). These deposits of calcium and plaques can partly clog and narrow the coronary arteries without producing any symptoms or warning signs. This puts a person at risk for a heart attack. This test can detect these deposits before symptoms develop. Tell a health care provider about: Any allergies you have. All medicines you are taking, including vitamins, herbs, eye drops, creams, and over-the-counter medicines. Any problems you or family members have had with anesthetic medicines. Any blood disorders you have. Any surgeries you have had. Any medical conditions you have. Whether you are pregnant or may be pregnant. What are the risks? Generally, this is a safe procedure. However, problems may occur, including: Harm to a pregnant woman and her unborn baby. This test involves the use of radiation. Radiation exposure can be dangerous to a  pregnant woman and her unborn baby. If you are pregnant, you generally should not have this procedure done. Slight increase in the risk of cancer. This is because of the radiation involved in the test. What happens before the procedure? No preparation is needed for this procedure. What happens during the procedure? You will undress and remove any jewelry around your neck or chest. You will put on a hospital gown. Sticky electrodes will be placed on your chest. The electrodes will be connected to an electrocardiogram (ECG) machine to record a tracing of the electrical activity of your heart. A CT scanner will take pictures of your heart. During this time, you will be asked to lie still and hold your breath for 2-3 seconds while a picture of your heart is being taken. The procedure may vary among health care providers and hospitals. What happens after the procedure? You can get dressed. You can return to your normal activities. It is up to you to get the results of your test. Ask your health care provider, or the department that is doing the test, when your results will be ready. Summary A coronary calcium scan is an imaging test used to look for deposits of calcium and other fatty materials (plaques) in the inner lining of the blood vessels of the heart (coronary arteries). Generally, this is a safe procedure. Tell your health care provider if you are pregnant or may be pregnant. No preparation is needed for this procedure. A CT scanner will take pictures of your heart. You can return to your normal activities after the scan is  done. This information is not intended to replace advice given to you by your health care provider. Make sure you discuss any questions you have with your health care provider. Document Released: 12/22/2007 Document Revised: 05/14/2016 Document Reviewed: 05/14/2016 Elsevier Interactive Patient Education  2017 [employer].    Follow-Up: At [facility], you  and your health needs are our priority.  As part of our continuing mission to provide you with exceptional heart care, we have created designated Provider Care Teams.  These Care Teams include your primary Cardiologist (physician) and Advanced Practice Providers (APPs -  Physician Assistants and Nurse Practitioners) who all work together to provide you with the care you need, when you need it.  We recommend signing up for the patient portal called "MyChart".  Sign up information is provided on this After Visit Summary.  MyChart is used to connect with patients for Virtual Visits (Telemedicine).  Patients are able to view lab/test results, encounter notes, upcoming appointments, etc.  Non-urgent messages can be sent to your provider as well.   To learn more about what you can do with MyChart, go to [email or web address].    Your next appointment:   6 week(s)  The format for your next appointment:   In Person  Provider:   Nanetta Batty, MD     Other Instructions Dr. Allyson Sabal recommends getting an omron blood pressure cuff for the upper arm.   Heart-Healthy Eating Plan Eating a healthy diet is important for the health of your heart. A heart-healthy eating plan includes: Eating less unhealthy fats. Eating more healthy fats. Eating less salt in your food. Salt is also called sodium. What are tips for following this plan? Cooking Avoid frying your food. Try to bake, boil, grill, or broil it instead. You can also reduce fat by: Removing the skin from poultry. Removing all visible fats from meats. Steaming vegetables in water or broth. Meal planning  At meals, divide your plate into four equal parts: Fill one-half of your plate with vegetables and green salads. Fill one-fourth of your plate with whole grains. Fill one-fourth of your plate with lean protein foods. Eat 2-4 cups of vegetables per day. One cup of vegetables is: 1 cup (91 g) broccoli or cauliflower florets. 2 medium  carrots. 1 large bell pepper. 1 large sweet potato. 1 large tomato. 1 medium white potato. 2 cups (150 g) raw leafy greens. Eat 1-2 cups of fruit per day. One cup of fruit is: 1 small apple 1 large banana 1 cup (237 g) mixed fruit, 1 large orange,  cup (82 g) dried fruit, 1 cup (240 mL) 100% fruit juice. Eat more foods that have soluble fiber. These are apples, broccoli, carrots, beans, peas, and barley. Try to get 20-30 g of fiber per day. Eat 4-5 servings of nuts, legumes, and seeds per week: 1 serving of dried beans or legumes equals  cup (90 g) cooked. 1 serving of nuts is  oz (12 almonds, 24 pistachios, or 7 walnut halves). 1 serving of seeds equals  oz (8 g). General information Eat more home-cooked food. Eat less restaurant, buffet, and fast food. Limit or avoid alcohol. Limit foods that are high in starch and sugar. Avoid fried foods. Lose weight if you are overweight. Keep track of how much salt (sodium) you eat. This is important if you have high blood pressure. Ask your doctor to tell you more about this. Try to add vegetarian meals each week. Fats Choose healthy fats. These include olive  oil and canola oil, flaxseeds, walnuts, almonds, and seeds. Eat more omega-3 fats. These include salmon, mackerel, sardines, tuna, flaxseed oil, and ground flaxseeds. Try to eat fish at least 2 times each week. Check food labels. Avoid foods with trans fats or high amounts of saturated fat. Limit saturated fats. These are often found in animal products, such as meats, butter, and cream. These are also found in plant foods, such as palm oil, palm kernel oil, and coconut oil. Avoid foods with partially hydrogenated oils in them. These have trans fats. Examples are stick margarine, some tub margarines, cookies, crackers, and other baked goods. What foods should I eat? Fruits All fresh, canned (in natural juice), or frozen fruits. Vegetables Fresh or frozen vegetables (raw,  steamed, roasted, or grilled). Green salads. Grains Most grains. Choose whole wheat and whole grains most of the time. Rice and pasta, including brown rice and pastas made with whole wheat. Meats and other proteins Lean, well-trimmed beef, veal, pork, and lamb. Chicken and [place] without skin. All fish and shellfish. Wild duck, rabbit, pheasant, and venison. Egg whites or low-cholesterol egg substitutes. Dried beans, peas, lentils, and tofu. Seeds and most nuts. Dairy Low-fat or nonfat cheeses, including ricotta and mozzarella. Skim or 1% milk that is liquid, powdered, or evaporated. Buttermilk that is made with low-fat milk. Nonfat or low-fat yogurt. Fats and oils Non-hydrogenated (trans-free) margarines. Vegetable oils, including soybean, sesame, sunflower, olive, peanut, safflower, corn, canola, and cottonseed. Salad dressings or mayonnaise made with a vegetable oil. Beverages Mineral water. Coffee and tea. Diet carbonated beverages. Sweets and desserts Sherbet, gelatin, and fruit ice. Small amounts of dark chocolate. Limit all sweets and desserts. Seasonings and condiments All seasonings and condiments. The items listed above may not be a complete list of foods and drinks you can eat. Contact a dietitian for more options. What foods should I avoid? Fruits Canned fruit in heavy syrup. Fruit in cream or butter sauce. Fried fruit. Limit coconut. Vegetables Vegetables cooked in cheese, cream, or butter sauce. Fried vegetables. Grains Breads that are made with saturated or trans fats, oils, or whole milk. Croissants. Sweet rolls. Donuts. High-fat crackers, such as cheese crackers. Meats and other proteins Fatty meats, such as hot dogs, ribs, sausage, bacon, rib-eye roast or steak. High-fat deli meats, such as salami and bologna. Caviar. Domestic duck and goose. Organ meats, such as liver. Dairy Cream, sour cream, cream cheese, and creamed cottage cheese. Whole-milk cheeses. Whole or 2% milk  that is liquid, evaporated, or condensed. Whole buttermilk. Cream sauce or high-fat cheese sauce. Yogurt that is made from whole milk. Fats and oils Meat fat, or shortening. Cocoa butter, hydrogenated oils, palm oil, coconut oil, palm kernel oil. Solid fats and shortenings, including bacon fat, salt pork, lard, and butter. Nondairy cream substitutes. Salad dressings with cheese or sour cream. Beverages Regular sodas and juice drinks with added sugar. Sweets and desserts Frosting. Pudding. Cookies. Cakes. Pies. Milk chocolate or white chocolate. Buttered syrups. Full-fat ice cream or ice cream drinks. The items listed above may not be a complete list of foods and drinks to avoid. Contact a dietitian for more information. Summary Heart-healthy meal planning includes eating less unhealthy fats, eating more healthy fats, and making other changes in your diet. Eat a balanced diet. This includes fruits and vegetables, low-fat or nonfat dairy, lean protein, nuts and legumes, whole grains, and heart-healthy oils and fats. This information is not intended to replace advice given to you by your health care provider. Make  sure you discuss any questions you have with your health care provider. Document Revised: 07/31/2021 Document Reviewed: 07/31/2021 Elsevier Patient Education  2023 Elsevier Inc.   [place]:

## 2022-06-07 ENCOUNTER — Ambulatory Visit: Payer: 59 | Attending: Internal Medicine | Admitting: Pharmacist

## 2022-06-07 VITALS — BP 164/121 | HR 10

## 2022-06-07 DIAGNOSIS — I10 Essential (primary) hypertension: Secondary | ICD-10-CM | POA: Diagnosis not present

## 2022-06-07 NOTE — Progress Notes (Signed)
Patient ID: Russell Duarte                 DOB: [birth year]                      MRN: [id number]     HPI: Shae Augello is a 51 y.o. male referred by Dr. Allyson Sabal to HTN clinic. PMH is significant for HTN and obesity. He was found to be hypertensive at appt with orthopedic surgeon and was seen at urgent care who prescribed lisinopril-HCTZ. First seen by Dr Allyson Sabal on 05/24/22. BP was elevated at 162/100 at that time. He was advised to monitor his BP for 30 days and was referred to PharmD for follow up. Also pending calcium scoring given FHx of CAD as well as sleep study given nocturnal snoring and daytime somnolence.  Multiple factors likely leading to his elevated BP including pain, NSAID use, and stress. Has been unable to get steroid injection for back pain due to herniated disk because of elevated BP. Has been using daily meloxicam for pain in addition to Percocet, gabapentin, and tramadol. Also has a stressful job in [employer].   Has not been able to sit down as sitting worsens his back pain, so he is either standing during the day or sleeping on his stomach at night. Has started checking his BP using a wrist cuff. He rests his wrist at heart level and checks while standing. Did not bring in cuff today, does bring in a detailed list of readings: 130s-140s/80s-90s. Will increase to 160s-170s when he's in more pain, this usually worsens throughout the day as well. Was advised his BP needs to be below 170/100 for a steroid injection. Takes his lisinopril-HCTZ at 5am. Denies headache and blurred vision. Initial BP today 217/132, improved to 164/121 on recheck about 10 minutes later.  Reports he lost about 30 lbs since starting testosterone injections. Had been more active (before herniated disk) and had been eating less. 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 Has a family history of stroke in his sister at age 55 due to birth control, as well as in  his father and his father's siblings.  Current HTN meds: lisinopril-HCTZ 20-12.5mg  daily BP goal: <130/52mmHg  Family History: Father with hx of CAD  Social History: Denies tobacco and drug use.  Diet: Has cut out a lot of salt from his diet, trying to cut back on fast food (on the road a lot for work)  Exercise:   Home BP readings: 145/93, 137/87, 138/91, 137/90, 131/90, 128/89, 137/91, 133/88, 140/85, 147/95, 141/94, 135/91, 144/101  Wt Readings from Last 3 Encounters:  05/24/22 274 lb 6.4 oz (124.5 kg)  10/10/21 285 lb (129.3 kg)  07/22/18 276 lb 7.3 oz (125.4 kg)   BP Readings from Last 3 Encounters:  05/24/22 (!) 162/100  10/10/21 (!) 142/92  07/22/18 (!) 142/98   Pulse Readings from Last 3 Encounters:  05/24/22 89  10/10/21 88  07/22/18 73    Renal function: CrCl cannot be calculated (Patient's most recent lab result is older than the maximum 21 days allowed.).  No past medical history on file.  Current Outpatient Medications on File Prior to Visit  Medication Sig Dispense Refill   gabapentin (NEURONTIN) 300 MG capsule Take 300 mg by mouth.     lisinopril-hydrochlorothiazide (ZESTORETIC) 20-12.5 MG tablet Take 1 tablet by mouth daily. 90 tablet 1  meloxicam (MOBIC) 15 MG tablet Take 15 mg by mouth daily.     ondansetron (ZOFRAN) 4 MG tablet Take 4 mg by mouth every 6 (six) hours as needed.     oxyCODONE-acetaminophen (PERCOCET/ROXICET) 5-325 MG tablet Take 1 tablet by mouth every 4 (four) hours as needed. 20 tablet 0   traMADol HCl 100 MG TABS Take 1 tablet by mouth 4 (four) times daily.     No current facility-administered medications on file prior to visit.    No Known Allergies   Assessment/Plan:  1. Hypertension - BP elevated above goal < 130/11mmHg, remains too high in clinic for steroid injection that's needed due to herniated disk. HTN also worsened by high stress job, pain from herniated disk, and daily NSAID use to help with his pain. He will take  an extra lisinopril-HCTZ 20-12.5mg  today and if his CMET is stable, will make dose increase permanent. He will purchase a bicep BP cuff for better accuracy and continue to monitor BP. I'll call him in a week to evaluate readings and can add on amlodipine if needed.  Baseline lipids also being checked today per Dr Allyson Sabal. Will need cardiologist clearance for spinal injection once BP is under control.  Ainsleigh Kakos E. Derita Michelsen, PharmD, BCACP, CPP [facility] [address] [place], [place] [address] Phone: [phone]; Fax: [phone] 06/07/2022 4:32 PM

## 2022-06-07 NOTE — Patient Instructions (Addendum)
Your blood pressure goal is < 130/32mmHg  Take an extra lisinopril-HCTZ when you get home today  I'll call you tomorrow when your labs are back, if everything is normal we'll increase the dose of lisinopril-HCTZ  Look for an adult large blood pressure cuff and monitor your readings at home  If your readings are over 150/100 in a week, call Aundra Millet, PharmD #[phone] and we'll add another medication called amlodipine

## 2022-06-08 ENCOUNTER — Telehealth: Payer: Self-pay | Admitting: Pharmacist

## 2022-06-08 LAB — COMPREHENSIVE METABOLIC PANEL
ALT: 88 IU/L — ABNORMAL HIGH (ref 0–44)
AST: 82 IU/L — ABNORMAL HIGH (ref 0–40)
Albumin/Globulin Ratio: 1.7 (ref 1.2–2.2)
Albumin: 4.7 g/dL (ref 4.1–5.1)
Alkaline Phosphatase: 60 IU/L (ref 44–121)
BUN/Creatinine Ratio: 12 (ref 9–20)
BUN: 15 mg/dL (ref 6–24)
Bilirubin Total: 1.6 mg/dL — ABNORMAL HIGH (ref 0.0–1.2)
CO2: 25 mmol/L (ref 20–29)
Calcium: 9.8 mg/dL (ref 8.7–10.2)
Chloride: 98 mmol/L (ref 96–106)
Creatinine, Ser: 1.27 mg/dL (ref 0.76–1.27)
Globulin, Total: 2.8 g/dL (ref 1.5–4.5)
Glucose: 89 mg/dL (ref 70–99)
Potassium: 5.3 mmol/L — ABNORMAL HIGH (ref 3.5–5.2)
Sodium: 137 mmol/L (ref 134–144)
Total Protein: 7.5 g/dL (ref 6.0–8.5)
eGFR: 69 mL/min/{1.73_m2} (ref >59)

## 2022-06-08 LAB — LIPID PANEL
Chol/HDL Ratio: 4.4 ratio (ref 0.0–5.0)
Cholesterol, Total: 206 mg/dL — ABNORMAL HIGH (ref 100–199)
HDL: 47 mg/dL (ref >39)
LDL Chol Calc (NIH): 140 mg/dL — ABNORMAL HIGH (ref 0–99)
Triglycerides: 108 mg/dL (ref 0–149)
VLDL Cholesterol Cal: 19 mg/dL (ref 5–40)

## 2022-06-08 LAB — LDL CHOLESTEROL, DIRECT: LDL Direct: 136 mg/dL — ABNORMAL HIGH (ref 0–99)

## 2022-06-08 MED ORDER — LISINOPRIL-HYDROCHLOROTHIAZIDE 20-12.5 MG PO TABS: 1.0000 | ORAL_TABLET | Freq: Every day | ORAL | 5 refills | Status: DC

## 2022-06-08 MED ORDER — AMLODIPINE BESYLATE 5 MG PO TABS: 5.0000 mg | ORAL_TABLET | Freq: Every day | ORAL | 5 refills | Status: DC

## 2022-06-08 NOTE — Telephone Encounter (Signed)
K elevated to 5.3 after starting lisinopril-HCTZ 20-12.5mg , SCr slightly up at 1.27 as well. Pt states he's been taking extra potassium supplementation and eating a lot of bananas due to cramping in his legs from his herniated disk. Advised him to stop taking the supplement and cut back a bit on bananas, and to stay hydrated with water. Will keep med at this dose and recheck at f/u appt in 10-14 days with me. Will also start amlodipine 5mg  daily for further BP control.  LDL also 140 at baseline and pt has family history of CAD. He is pending coronary calcium score next week, will wait for results to assess potential statin initiation.

## 2022-06-11 ENCOUNTER — Ambulatory Visit (HOSPITAL_BASED_OUTPATIENT_CLINIC_OR_DEPARTMENT_OTHER)
Admission: RE | Admit: 2022-06-11 | Discharge: 2022-06-11 | Disposition: A | Discharge status: Home / Self Care | Payer: 59 | Source: Ambulatory Visit | Attending: Cardiovascular Disease | Admitting: Cardiovascular Disease

## 2022-06-11 DIAGNOSIS — Z8249 Family history of ischemic heart disease and other diseases of the circulatory system: Secondary | ICD-10-CM | POA: Insufficient documentation

## 2022-06-11 DIAGNOSIS — G4733 Obstructive sleep apnea (adult) (pediatric): Secondary | ICD-10-CM | POA: Insufficient documentation

## 2022-06-11 DIAGNOSIS — I10 Essential (primary) hypertension: Secondary | ICD-10-CM | POA: Insufficient documentation

## 2022-06-15 ENCOUNTER — Telehealth: Payer: Self-pay | Admitting: Pharmacist

## 2022-06-15 MED ORDER — ATORVASTATIN CALCIUM 40 MG PO TABS: 40.0000 mg | ORAL_TABLET | Freq: Every day | ORAL | 3 refills | Status: DC

## 2022-06-15 NOTE — Telephone Encounter (Addendum)
Called pt to follow up with BP over past week. Reports 130s/80s-90s. Tolerating amlodipine well, denies dizziness or LE edema. Reports new cuff measuring 6-95mmHg different from his old wrist cuff. He will bring this to appt next week to verify accuracy of new cuff. Overall feeling a little better, more relaxed/less tense. Still not sleeping well, advised he could try moving 2nd gabapentin dose to bedtime to see if it helps. Will increase his amlodipine from 5mg  to 10mg  daily for more aggressive BP management (clinic BP historically much higher than home readings and needs controlled BP to have back surgery which has been causing him significant pain, he is very eager to have his surgery and needs cards clearance first which requires controlled BP). He'll keep f/u with me next week. Will update amlodipine rx at that time (he has enough to double up on home dose until then).  He also asks about his calcium score, he read the results on MyChart. I reviewed results with him and advised him that Dr wants him to start on atorvastatin, rx has been sent to his pharmacy. I'll place lab order for recheck when I see him next week.

## 2022-06-20 ENCOUNTER — Ambulatory Visit: Payer: 59 | Attending: Cardiovascular Disease | Admitting: Pharmacist

## 2022-06-20 VITALS — BP 128/84 | HR 101

## 2022-06-20 DIAGNOSIS — I10 Essential (primary) hypertension: Secondary | ICD-10-CM | POA: Diagnosis not present

## 2022-06-20 MED ORDER — LISINOPRIL-HYDROCHLOROTHIAZIDE 20-12.5 MG PO TABS: 1.0000 | ORAL_TABLET | Freq: Every day | ORAL | 3 refills | Status: DC

## 2022-06-20 MED ORDER — AMLODIPINE BESYLATE 10 MG PO TABS: 10.0000 mg | ORAL_TABLET | Freq: Every day | ORAL | 3 refills | Status: DC

## 2022-06-20 NOTE — Patient Instructions (Addendum)
Your blood pressure is excellent and much improved, now very close to goal < 130/80  Continue taking your current medications and keep your follow up with Dr Allyson Sabal at the end of the month  Recheck fasting cholesterol on Monday, February 12th any time after 7:30am (at [facility] office)  Continue to monitor your blood pressure at home. Once your pain is under control, your pressure may drop. If you notice the top blood pressure # staying below 105-110 or if you become dizzy/fatigued, let me know and we may be able to back off of some of your blood pressure medication [phone]

## 2022-06-20 NOTE — Progress Notes (Signed)
Patient ID: Russell Duarte                 DOB: 1971/02/28                      MRN: [id number]     HPI: Russell Duarte is a 51 y.o. male referred by Dr. Allyson Sabal to HTN clinic. PMH is significant for HTN and obesity. He was found to be hypertensive at appt with orthopedic surgeon and was seen at urgent care who prescribed lisinopril-HCTZ. First seen by Dr Allyson Sabal on 05/24/22. BP was elevated at 162/100 at that time. He was advised to monitor his BP for 30 days and was referred to PharmD for follow up. Also pending calcium scoring given FHx of CAD as well as sleep study given nocturnal snoring and daytime somnolence.  Multiple factors likely leading to his elevated BP including pain, NSAID use, and stress. Has been unable to get steroid injection for back pain due to herniated disk because of elevated BP. Has been using daily meloxicam for pain in addition to Percocet, gabapentin, and tramadol. Also has a stressful job in [employer]. Has not been able to sit down as sitting worsens his back pain, so he is either standing during the day or sleeping on his stomach at night. Was advised his BP needs to be below 170/100 for steroid injection.   At last visit with me on 06/07/22, BP was 164/121. His K had increased to 5.3 and he had a mild increase in SCr. Had reported using OTC potassium supplementation and eating more bananas due to cramping in his legs from herniated disk. He was advised to stop both with plan to recheck labs at f/u appt today. I also started amlodipine 5mg  daily which I increased to 10mg  on 12/8 phone call. Reviewed CAC results with him at the time as well. Calcium score 06/11/22 was elevated at 49 (84th percentile for age, gender and race matched controls). He was advised to start atorvastatin 40mg  daily per Dr 14/8.  Pt presents today for follow up. Brings in new bicep cuff (previously had been using a wrist cuff). Reports home BP readings controlled 120s/80s, this AM 122/80 at home. See below for  more detailed readings.  Home cuff reading: 129/87 Clinic cuff reading: 128/84  Has been feeling a bit better. Able to sit down now. Denies dizziness, blurred vision, headache, and LE edema. Stopped taking K supplement. Eating a banana about every other day. Also eating more salads with grilled chicken or tuna, trying to avoid salt and reading more nutrition labels. Yogurt and toast in the AM. Changed to decaf tea. Managing stress better. Yoga stretches have been helping with pain. Has a family history of stroke in his sister at age 19 due to birth control, as well as in his father and his father's siblings.  Current HTN meds: lisinopril-HCTZ 20-12.5mg  daily (5am), amlodipine 10mg  daily (5am) BP goal: <130/47mmHg  Family History: Father with hx of CAD  Social History: Denies tobacco and drug use.  Diet: Has cut out a lot of salt from his diet, trying to cut back on fast food (on the road a lot for work). Hydrating with Body Armour - changed from Gatorade due to lower sodium content.   Exercise: limited currently due to herniated disk  Home BP readings: 123/81, 122/82, 119/77,119/92, 121/87, 134/79, 123/75, 126/82, 125/81. HR 101-114  Wt Readings from Last 3 Encounters:  05/24/22 274 lb 6.4 oz (124.5 kg)  10/10/21  285 lb (129.3 kg)  07/22/18 276 lb 7.3 oz (125.4 kg)   BP Readings from Last 3 Encounters:  06/07/22 (!) 164/121  05/24/22 (!) 162/100  10/10/21 (!) 142/92   Pulse Readings from Last 3 Encounters:  06/07/22 (!) 10  05/24/22 89  10/10/21 88    Renal function: CrCl cannot be calculated (Unknown ideal weight.).  No past medical history on file.  Current Outpatient Medications on File Prior to Visit  Medication Sig Dispense Refill   amLODipine (NORVASC) 5 MG tablet Take 2 tablets (10 mg total) by mouth daily. 30 tablet 5   atorvastatin (LIPITOR) 40 MG tablet Take 1 tablet (40 mg total) by mouth daily. 90 tablet 3   gabapentin (NEURONTIN) 300 MG capsule Take 300 mg by  mouth 3 (three) times daily.     lisinopril-hydrochlorothiazide (ZESTORETIC) 20-12.5 MG tablet Take 1 tablet by mouth daily. 30 tablet 5   meloxicam (MOBIC) 15 MG tablet Take 15 mg by mouth daily.     ondansetron (ZOFRAN) 4 MG tablet Take 4 mg by mouth every 6 (six) hours as needed.     oxyCODONE-acetaminophen (PERCOCET/ROXICET) 5-325 MG tablet Take 1 tablet by mouth every 4 (four) hours as needed. 20 tablet 0   traMADol HCl 100 MG TABS Take 1 tablet by mouth 4 (four) times daily.     No current facility-administered medications on file prior to visit.    No Known Allergies   Assessment/Plan:  1. Hypertension - BP much improved and very close to goal < 130/4mmHg with clinic reading, many home readings at goal and pt's cuff is measuring accurately. Will continue amlodipine 10mg  daily and lisinopril-HCTZ 20-12.5mg  daily. Rechecking BMET today (K and SCr slightly elevated on last labs, pt stopped taking OTC K supplement since then). Daily NSAID use for his herniated disk pain may be contributing to slightly elevated SCr. He'll continue with dietary improvements. Will forward note to Dr Gwenlyn Found - pt needs cardiac clearance before he's able to be scheduled for spinal injection for his herniated disk, he prefers to have this done as soon as possible due to his pain. Advised pt to continue monitoring BP once his pain improves as well in case BP meds need to be cut back.  2. Hyperlipidemia - Pt started on atorvastatin 40mg  daily earlier this week due to elevated calcium score with new LDL goal < 70. Will recheck fasting lipids and LFTs in 2 months.  F/u with PharmD as needed.  Hernando Reali E. Jourdan Maldonado, PharmD, BCACP, [place] [id number], [place], [place] [address] Phone: [phone]; Fax: [phone] 06/20/2022 8:02 AM

## 2022-06-21 ENCOUNTER — Telehealth: Payer: Self-pay | Admitting: Pharmacist

## 2022-06-21 ENCOUNTER — Encounter: Payer: Self-pay | Admitting: Pharmacist

## 2022-06-21 DIAGNOSIS — I10 Essential (primary) hypertension: Secondary | ICD-10-CM

## 2022-06-21 LAB — BASIC METABOLIC PANEL
BUN/Creatinine Ratio: 16 (ref 9–20)
BUN: 23 mg/dL (ref 6–24)
CO2: 25 mmol/L (ref 20–29)
Calcium: 9.5 mg/dL (ref 8.7–10.2)
Chloride: 97 mmol/L (ref 96–106)
Creatinine, Ser: 1.45 mg/dL — ABNORMAL HIGH (ref 0.76–1.27)
Glucose: 91 mg/dL (ref 70–99)
Potassium: 4.5 mmol/L (ref 3.5–5.2)
Sodium: 136 mmol/L (ref 134–144)
eGFR: 59 mL/min/{1.73_m2} — ABNORMAL LOW (ref >59)

## 2022-06-21 NOTE — Telephone Encounter (Signed)
Spoke with pt and advised him with improved BP readings we are ok with him proceeding with back injection for his herniated disk. He will call their office to see how soon his injection can be scheduled. Leaving f/u appt with Dr Allyson Sabal for now.  Also reviewed BMET results with pt. SCr unfortunately continues to rise. Pt reports not staying hydrated and will increase water intake. He also takes meloxicam 15mg  daily for his pain which can contribute to SCr increase. He will try to cut this in half and take 7.5mg  daily. The ED started him on lisinopril-HCTZ on 05/23/22 with subsequent SCr increases after to 1.27 then 1.45. Will have him cut his lisinopril-HCTZ in half. He will recheck BMET in 1 week. Will also continue to monitor his BP at home, will likely rise a bit but should still have flexibility and be acceptable for back injection - his BP at home today was 117/70s. I asked him to call me if his systolic increases over 140 in the next week, would add on low dose hydralazine if needed.  Pt appreciative for the call.

## 2022-06-21 NOTE — Telephone Encounter (Signed)
Russell Gess, MD  Ona Roehrs, Russell Duarte, RPH-CPP  That's fine with me Russell Duarte.  JJB       Previous Messages    ----- Message ----- From: Russell Duarte, RPH-CPP Sent: 06/20/2022  11:45 AM EST To: Russell Gess, MD; Bernita Buffy, RN  Pt requires cardiac clearance for steroid injection for his herniated disk. BP previously too high for injection, now at goal with a few med changes. Pt hoping to have injection done as soon as possible, I let him know I'd forward my note to you guys in case he's able to be cleared before his follow up visit with you at the end of the month based on improved BP readings. Thanks!

## 2022-06-25 ENCOUNTER — Encounter: Payer: Self-pay | Admitting: Pharmacist

## 2022-06-25 ENCOUNTER — Telehealth: Payer: Self-pay | Admitting: Cardiovascular Disease

## 2022-06-25 NOTE — Telephone Encounter (Signed)
Spoke with pt on the phone, clearance faxed to both #s

## 2022-06-25 NOTE — Telephone Encounter (Signed)
   Pre-operative Risk Assessment    Patient Name: Russell Duarte  DOB: [birth year] MRN: [id number]      Request for Surgical Clearance    Procedure:  ESI, epidural steroid injection of the lumbar spine  Date of Surgery:  Clearance 06/26/22                                 Surgeon:  Dr. Bettye Boeck Group or Practice Name:  [facility]  Phone number:  [phone] Fax number:  [phone] Attention: Jasmine December   Type of Clearance Requested:   - Medical    Type of Anesthesia:  Local    Additional requests/questions:    Signed, Selena Zobro   06/25/2022, 2:19 PM

## 2022-06-25 NOTE — Telephone Encounter (Signed)
Please see message from PharmD below. Dr. Allyson Sabal gave the OK for patient to proceed with spinal injection. This has already been faxed to requesting office. Will remove from pre-op pool.  Corrin Parker, PA-C 06/25/2022 3:59 PM

## 2022-06-25 NOTE — Telephone Encounter (Signed)
Clearance faxed, pt aware.

## 2022-06-25 NOTE — Telephone Encounter (Signed)
Pt required cardiac clearance for upcoming ESI - blood pressure initially too elevated for him to have ESI. Seen by Dr Allyson Sabal on 05/24/22 where BP was 162/100. Pt referred to PharmD for HTN follow up. Seen by me on 11/30 and 12/13. BP 164/121 on 11/30 visit, improved to 128/84 on 12/13 visit. Note forwarded to Dr Allyson Sabal to see if cardiac clearance could be provided before pt's follow up with him at the end of December as pt wishes to have ESI as soon as possible given pain that he's in. Received approval in writing from Dr Allyson Sabal for pt to proceed with ESI (see MyChart message to pt 06/21/22). Will fax this request over as well.

## 2022-06-26 DIAGNOSIS — M5416 Radiculopathy, lumbar region: Secondary | ICD-10-CM | POA: Diagnosis not present

## 2022-06-28 ENCOUNTER — Ambulatory Visit: Payer: 59

## 2022-06-29 ENCOUNTER — Ambulatory Visit: Payer: 59 | Attending: Cardiovascular Disease

## 2022-06-29 DIAGNOSIS — I10 Essential (primary) hypertension: Secondary | ICD-10-CM

## 2022-06-30 LAB — BASIC METABOLIC PANEL
BUN/Creatinine Ratio: 16 (ref 9–20)
BUN: 20 mg/dL (ref 6–24)
CO2: 24 mmol/L (ref 20–29)
Calcium: 9.6 mg/dL (ref 8.7–10.2)
Chloride: 100 mmol/L (ref 96–106)
Creatinine, Ser: 1.22 mg/dL (ref 0.76–1.27)
Glucose: 174 mg/dL — ABNORMAL HIGH (ref 70–99)
Potassium: 4.1 mmol/L (ref 3.5–5.2)
Sodium: 142 mmol/L (ref 134–144)
eGFR: 73 mL/min/{1.73_m2} (ref >59)

## 2022-07-04 ENCOUNTER — Telehealth: Payer: Self-pay | Admitting: Pharmacist

## 2022-07-04 MED ORDER — LISINOPRIL-HYDROCHLOROTHIAZIDE 20-12.5 MG PO TABS: 0.5000 | ORAL_TABLET | Freq: Every day | ORAL | 3 refills | Status: DC

## 2022-07-04 NOTE — Telephone Encounter (Signed)
SCr improved on 1/2 dose lisinopril-HCTZ. Spoke with pt, reports pain is about 50% better after back injection. BP 130-140/80 at home, saw 117/79 the other day. Will continue current meds. He also stopped taking his meloxicam, med list updated. Reports not fasting for labs and had a sweet tea right before which explains elevated glucose.

## 2022-07-06 ENCOUNTER — Ambulatory Visit: Payer: 59 | Admitting: Cardiovascular Disease

## 2022-07-17 ENCOUNTER — Ambulatory Visit: Payer: 59 | Admitting: Cardiovascular Disease

## 2022-07-20 DIAGNOSIS — M5416 Radiculopathy, lumbar region: Secondary | ICD-10-CM | POA: Diagnosis not present

## 2022-08-07 DIAGNOSIS — M5416 Radiculopathy, lumbar region: Secondary | ICD-10-CM | POA: Diagnosis not present

## 2022-08-20 ENCOUNTER — Ambulatory Visit: Payer: 59 | Attending: Cardiovascular Disease

## 2022-08-20 DIAGNOSIS — I10 Essential (primary) hypertension: Secondary | ICD-10-CM | POA: Diagnosis not present

## 2022-08-20 DIAGNOSIS — Z8249 Family history of ischemic heart disease and other diseases of the circulatory system: Secondary | ICD-10-CM | POA: Diagnosis not present

## 2022-08-20 DIAGNOSIS — G4733 Obstructive sleep apnea (adult) (pediatric): Secondary | ICD-10-CM | POA: Diagnosis not present

## 2022-08-21 LAB — LIPID PANEL
Chol/HDL Ratio: 3 ratio (ref 0.0–5.0)
Cholesterol, Total: 128 mg/dL (ref 100–199)
HDL: 43 mg/dL (ref >39)
LDL Chol Calc (NIH): 63 mg/dL (ref 0–99)
Triglycerides: 125 mg/dL (ref 0–149)
VLDL Cholesterol Cal: 22 mg/dL (ref 5–40)

## 2022-08-21 LAB — HEPATIC FUNCTION PANEL
ALT: 74 IU/L — ABNORMAL HIGH (ref 0–44)
AST: 53 IU/L — ABNORMAL HIGH (ref 0–40)
Albumin: 4.2 g/dL (ref 4.1–5.1)
Alkaline Phosphatase: 71 IU/L (ref 44–121)
Bilirubin Total: 1.4 mg/dL — ABNORMAL HIGH (ref 0.0–1.2)
Bilirubin, Direct: 0.34 mg/dL (ref 0.00–0.40)
Total Protein: 6.6 g/dL (ref 6.0–8.5)

## 2022-08-21 LAB — BASIC METABOLIC PANEL
BUN/Creatinine Ratio: 10 (ref 9–20)
BUN: 12 mg/dL (ref 6–24)
CO2: 23 mmol/L (ref 20–29)
Calcium: 9.4 mg/dL (ref 8.7–10.2)
Chloride: 101 mmol/L (ref 96–106)
Creatinine, Ser: 1.23 mg/dL (ref 0.76–1.27)
Glucose: 108 mg/dL — ABNORMAL HIGH (ref 70–99)
Potassium: 4.2 mmol/L (ref 3.5–5.2)
Sodium: 138 mmol/L (ref 134–144)
eGFR: 72 mL/min/{1.73_m2} (ref >59)

## 2022-08-22 ENCOUNTER — Other Ambulatory Visit: Payer: Self-pay

## 2022-08-22 DIAGNOSIS — E785 Hyperlipidemia, unspecified: Secondary | ICD-10-CM

## 2022-08-22 DIAGNOSIS — Z8249 Family history of ischemic heart disease and other diseases of the circulatory system: Secondary | ICD-10-CM

## 2022-08-24 ENCOUNTER — Other Ambulatory Visit: Payer: Self-pay

## 2022-08-24 DIAGNOSIS — E785 Hyperlipidemia, unspecified: Secondary | ICD-10-CM

## 2022-08-27 DIAGNOSIS — M25551 Pain in right hip: Secondary | ICD-10-CM | POA: Diagnosis not present

## 2022-08-27 DIAGNOSIS — Z79891 Long term (current) use of opiate analgesic: Secondary | ICD-10-CM | POA: Diagnosis not present

## 2022-08-27 DIAGNOSIS — G8929 Other chronic pain: Secondary | ICD-10-CM | POA: Diagnosis not present

## 2022-08-27 DIAGNOSIS — M5416 Radiculopathy, lumbar region: Secondary | ICD-10-CM | POA: Diagnosis not present

## 2022-08-27 DIAGNOSIS — M5116 Intervertebral disc disorders with radiculopathy, lumbar region: Secondary | ICD-10-CM | POA: Diagnosis not present

## 2022-09-17 DIAGNOSIS — G8929 Other chronic pain: Secondary | ICD-10-CM | POA: Diagnosis not present

## 2022-09-17 DIAGNOSIS — Z79891 Long term (current) use of opiate analgesic: Secondary | ICD-10-CM | POA: Diagnosis not present

## 2022-09-17 DIAGNOSIS — M25551 Pain in right hip: Secondary | ICD-10-CM | POA: Diagnosis not present

## 2022-09-17 DIAGNOSIS — M5116 Intervertebral disc disorders with radiculopathy, lumbar region: Secondary | ICD-10-CM | POA: Diagnosis not present

## 2022-09-17 DIAGNOSIS — M5416 Radiculopathy, lumbar region: Secondary | ICD-10-CM | POA: Diagnosis not present

## 2022-11-05 DIAGNOSIS — M25551 Pain in right hip: Secondary | ICD-10-CM | POA: Diagnosis not present

## 2022-11-05 DIAGNOSIS — M5416 Radiculopathy, lumbar region: Secondary | ICD-10-CM | POA: Diagnosis not present

## 2022-11-05 DIAGNOSIS — M5116 Intervertebral disc disorders with radiculopathy, lumbar region: Secondary | ICD-10-CM | POA: Diagnosis not present

## 2022-11-05 DIAGNOSIS — G8929 Other chronic pain: Secondary | ICD-10-CM | POA: Diagnosis not present

## 2022-12-18 DIAGNOSIS — M545 Low back pain, unspecified: Secondary | ICD-10-CM | POA: Diagnosis not present

## 2022-12-18 DIAGNOSIS — M5441 Lumbago with sciatica, right side: Secondary | ICD-10-CM | POA: Diagnosis not present

## 2023-01-04 DIAGNOSIS — M5416 Radiculopathy, lumbar region: Secondary | ICD-10-CM | POA: Diagnosis not present

## 2023-01-08 ENCOUNTER — Other Ambulatory Visit: Payer: Self-pay | Admitting: Orthopedic Surgery

## 2023-01-23 DIAGNOSIS — M545 Low back pain, unspecified: Secondary | ICD-10-CM | POA: Diagnosis not present

## 2023-02-13 ENCOUNTER — Ambulatory Visit: Admit: 2023-02-13 | Payer: 59 | Admitting: Orthopedic Surgery

## 2023-02-13 SURGERY — LUMBAR LAMINECTOMY/DECOMPRESSION MICRODISCECTOMY
Anesthesia: General | Laterality: Right

## 2023-07-07 ENCOUNTER — Other Ambulatory Visit: Payer: Self-pay | Admitting: Cardiovascular Disease

## 2023-08-10 ENCOUNTER — Other Ambulatory Visit: Payer: Self-pay | Admitting: Cardiovascular Disease

## 2023-08-30 ENCOUNTER — Other Ambulatory Visit: Payer: Self-pay | Admitting: Cardiovascular Disease

## 2023-09-18 ENCOUNTER — Other Ambulatory Visit: Payer: Self-pay | Admitting: Cardiovascular Disease

## 2023-09-30 ENCOUNTER — Ambulatory Visit: Payer: 59 | Attending: Cardiovascular Disease | Admitting: Cardiovascular Disease

## 2023-09-30 ENCOUNTER — Encounter: Payer: Self-pay | Admitting: Cardiovascular Disease

## 2023-09-30 VITALS — BP 154/96 | Ht 72.0 in | Wt 260.0 lb

## 2023-09-30 DIAGNOSIS — I10 Essential (primary) hypertension: Secondary | ICD-10-CM | POA: Diagnosis not present

## 2023-09-30 DIAGNOSIS — R931 Abnormal findings on diagnostic imaging of heart and coronary circulation: Secondary | ICD-10-CM

## 2023-09-30 DIAGNOSIS — E785 Hyperlipidemia, unspecified: Secondary | ICD-10-CM | POA: Insufficient documentation

## 2023-09-30 DIAGNOSIS — E782 Mixed hyperlipidemia: Secondary | ICD-10-CM | POA: Diagnosis not present

## 2023-09-30 MED ORDER — LISINOPRIL-HYDROCHLOROTHIAZIDE 20-12.5 MG PO TABS: 1.0000 | ORAL_TABLET | Freq: Every day | ORAL | 3 refills | Status: AC

## 2023-09-30 NOTE — Assessment & Plan Note (Signed)
 History of essential hypertension her blood pressure measured today at 154/96.  He is on amlodipine, lisinopril and hydrochlorothiazide although he did not take his lisinopril hydrochlorothiazide for the last 10 days.  Will refill this.

## 2023-09-30 NOTE — Patient Instructions (Signed)
 Medication Instructions:  Your physician recommends that you continue on your current medications as directed. Please refer to the Current Medication list given to you today.  *If you need a refill on your cardiac medications before your next appointment, please call your pharmacy*   Lab Work: Your physician recommends that you return for lab work in: the next week or 2 for FASTING lipid/liver panel  If you have labs (blood work) drawn today and your tests are completely normal, you will receive your results only by: MyChart Message (if you have MyChart) OR A paper copy in the mail If you have any lab test that is abnormal or we need to change your treatment, we will call you to review the results.   Follow-Up: At [facility], you and your health needs are our priority.  As part of our continuing mission to provide you with exceptional heart care, we have created designated Provider Care Teams.  These Care Teams include your primary Cardiologist (physician) and Advanced Practice Providers (APPs -  Physician Assistants and Nurse Practitioners) who all work together to provide you with the care you need, when you need it.  We recommend signing up for the patient portal called "MyChart".  Sign up information is provided on this After Visit Summary.  MyChart is used to connect with patients for Virtual Visits (Telemedicine).  Patients are able to view lab/test results, encounter notes, upcoming appointments, etc.  Non-urgent messages can be sent to your provider as well.   To learn more about what you can do with MyChart, go to [email or web address].    Your next appointment:   12 month(s)  Provider:   Nanetta Batty, MD    Other Instructions   1st Floor: - Lobby - Registration  - Pharmacy  - Lab - Cafe  2nd Floor: - PV Lab - Diagnostic Testing (echo, CT, nuclear med)  3rd Floor: - Vacant  4th Floor: - TCTS (cardiothoracic surgery) - AFib Clinic - Structural  Heart Clinic - Vascular Surgery  - Vascular Ultrasound  5th Floor: - HeartCare Cardiology (general and EP) - Clinical Pharmacy for coumadin, hypertension, lipid, weight-loss medications, and med management appointments    Valet parking services will be available as well.

## 2023-09-30 NOTE — Assessment & Plan Note (Signed)
 History of hyperlipidemia on statin therapy with lipid profile performed 08/20/2022 revealing total cholesterol 128, LDL 63 and HDL 43.  We will recheck a lipid liver profile.

## 2023-09-30 NOTE — Assessment & Plan Note (Signed)
 Coronary calcium score performed 06/11/22 was 49 all of which was in the LAD.  He is at goal for secondary prevention with regards to his lipid profile.  He is completely asymptomatic.

## 2023-09-30 NOTE — Assessment & Plan Note (Signed)
 BMI 35.  He is lost over 50 pounds from testosterone microdosing.

## 2023-09-30 NOTE — Progress Notes (Signed)
 09/30/2023 Russell Duarte   Dec 14, 1970  [id number]  Primary Physician Patient, No Pcp Per Primary Cardiologist: Runell Gess MD Russell Duarte, Russell Duarte, [place]  HPI:  Russell Duarte is a 53 y.o.   moderately overweight married Caucasian male father of 3 who I last saw in the office 05/24/2022.  He is a Programmer, multimedia.  He was referred by the urgent care clinic because of recently diagnosed hypertension.  He apparently has had a very stressful job and for a long time was fairly physical.  He had back problems in the past and over the last several months he has had recurrent back pain from herniated disks.  There is no history of diabetes or hyperlipidemia.  His father did have CAD.  Is never had a heart attack or stroke.  He denies chest pain or shortness of breath.  Does have symptoms compatible obstructive sleep apnea.  He apparently is lost 35 pounds over the last several months because of testosterone replacement therapy.  Interestingly, his back pain has resolved spontaneously.  Since I saw him a year and a half ago he has remained stable.  He denies chest pain or shortness of breath.  His weight is down 14 pounds from when I saw him last.  He does snore at night but is not somnolent during the daytime hours.  He did have a coronary calcium score performed 06/11/22 that was 49 all of which was in the LAD territory.  He has a goal for secondary prevention with regards to his lipid profile.     Current Meds  Medication Sig   amLODipine (NORVASC) 10 MG tablet TAKE 1 TABLET BY MOUTH EVERY DAY   atorvastatin (LIPITOR) 40 MG tablet TAKE 1 TABLET BY MOUTH EVERY DAY   sertraline (ZOLOFT) 100 MG tablet Take 100 mg by mouth daily.   testosterone cypionate (DEPOTESTOSTERONE CYPIONATE) 200 MG/ML injection Inject into the muscle daily.   [DISCONTINUED] lisinopril-hydrochlorothiazide (ZESTORETIC) 20-12.5 MG tablet TAKE 1 TABLET BY MOUTH EVERY DAY     No Known Allergies  Social History    Socioeconomic History   Marital status: Married    Spouse name: Not on file   Number of children: Not on file   Years of education: Not on file   Highest education level: Not on file  Occupational History   Not on file  Tobacco Use   Smoking status: Never   Smokeless tobacco: Never  Substance and Sexual Activity   Alcohol use: Yes   Drug use: Never   Sexual activity: Not on file  Other Topics Concern   Not on file  Social History Narrative   Not on file   Social Drivers of Health   Financial Resource Strain: Not on file  Food Insecurity: Not on file  Transportation Needs: Not on file  Physical Activity: Not on file  Stress: Not on file  Social Connections: Not on file  Intimate Partner Violence: Not on file     Review of Systems: General: negative for chills, fever, night sweats or weight changes.  Cardiovascular: negative for chest pain, dyspnea on exertion, edema, orthopnea, palpitations, paroxysmal nocturnal dyspnea or shortness of breath Dermatological: negative for rash Respiratory: negative for cough or wheezing Urologic: negative for hematuria Abdominal: negative for nausea, vomiting, diarrhea, bright red blood per rectum, melena, or hematemesis Neurologic: negative for visual changes, syncope, or dizziness All other systems reviewed and are otherwise negative except as noted above.    Blood pressure (!) 154/96,  height 6' (1.829 m), weight 260 lb (117.9 kg), SpO2 98%.  General appearance: alert and no distress Neck: no adenopathy, no carotid bruit, no JVD, supple, symmetrical, trachea midline, and thyroid not enlarged, symmetric, no tenderness/mass/nodules Lungs: clear to auscultation bilaterally Heart: regular rate and rhythm, S1, S2 normal, no murmur, click, rub or gallop Extremities: extremities normal, atraumatic, no cyanosis or edema Pulses: 2+ and symmetric Skin: Skin color, texture, turgor normal. No rashes or lesions Neurologic: Grossly  normal  EKG EKG Interpretation Date/Time:  Monday September 30 2023 13:35:59 EDT Ventricular Rate:  92 PR Interval:  164 QRS Duration:  104 QT Interval:  324 QTC Calculation: 400 R Axis:   72  Text Interpretation: Normal sinus rhythm ST & T wave abnormality, consider inferior ischemia No previous ECGs available Confirmed by Russell Duarte [phone]) on 09/30/2023 1:42:38 PM    ASSESSMENT AND PLAN:   Essential hypertension History of essential hypertension her blood pressure measured today at 154/96.  He is on amlodipine, lisinopril and hydrochlorothiazide although he did not take his lisinopril hydrochlorothiazide for the last 10 days.  Will refill this.  Morbid obesity (HCC) BMI 35.  He is lost over 50 pounds from testosterone microdosing.  Hyperlipidemia History of hyperlipidemia on statin therapy with lipid profile performed 08/20/2022 revealing total cholesterol 128, LDL 63 and HDL 43.  We will recheck a lipid liver profile.  Elevated coronary artery calcium score Coronary calcium score performed 06/11/22 was 49 all of which was in the LAD.  He is at goal for secondary prevention with regards to his lipid profile.  He is completely asymptomatic.     Runell Gess MD FACP,FACC,FAHA, [facility] 09/30/2023 1:53 PM

## 2023-10-27 ENCOUNTER — Other Ambulatory Visit: Payer: Self-pay | Admitting: Cardiovascular Disease

## 2024-01-25 IMAGING — DX DG HIP (WITH OR WITHOUT PELVIS) 2-3V*R*
3 series · 3 of 3 positions shown · non-contrast
Comparison: None.

CLINICAL DATA: Right hip pain 1 week

EXAM:
DG HIP (WITH OR WITHOUT PELVIS) 2-3V RIGHT

[hip ap (1 of 2)]
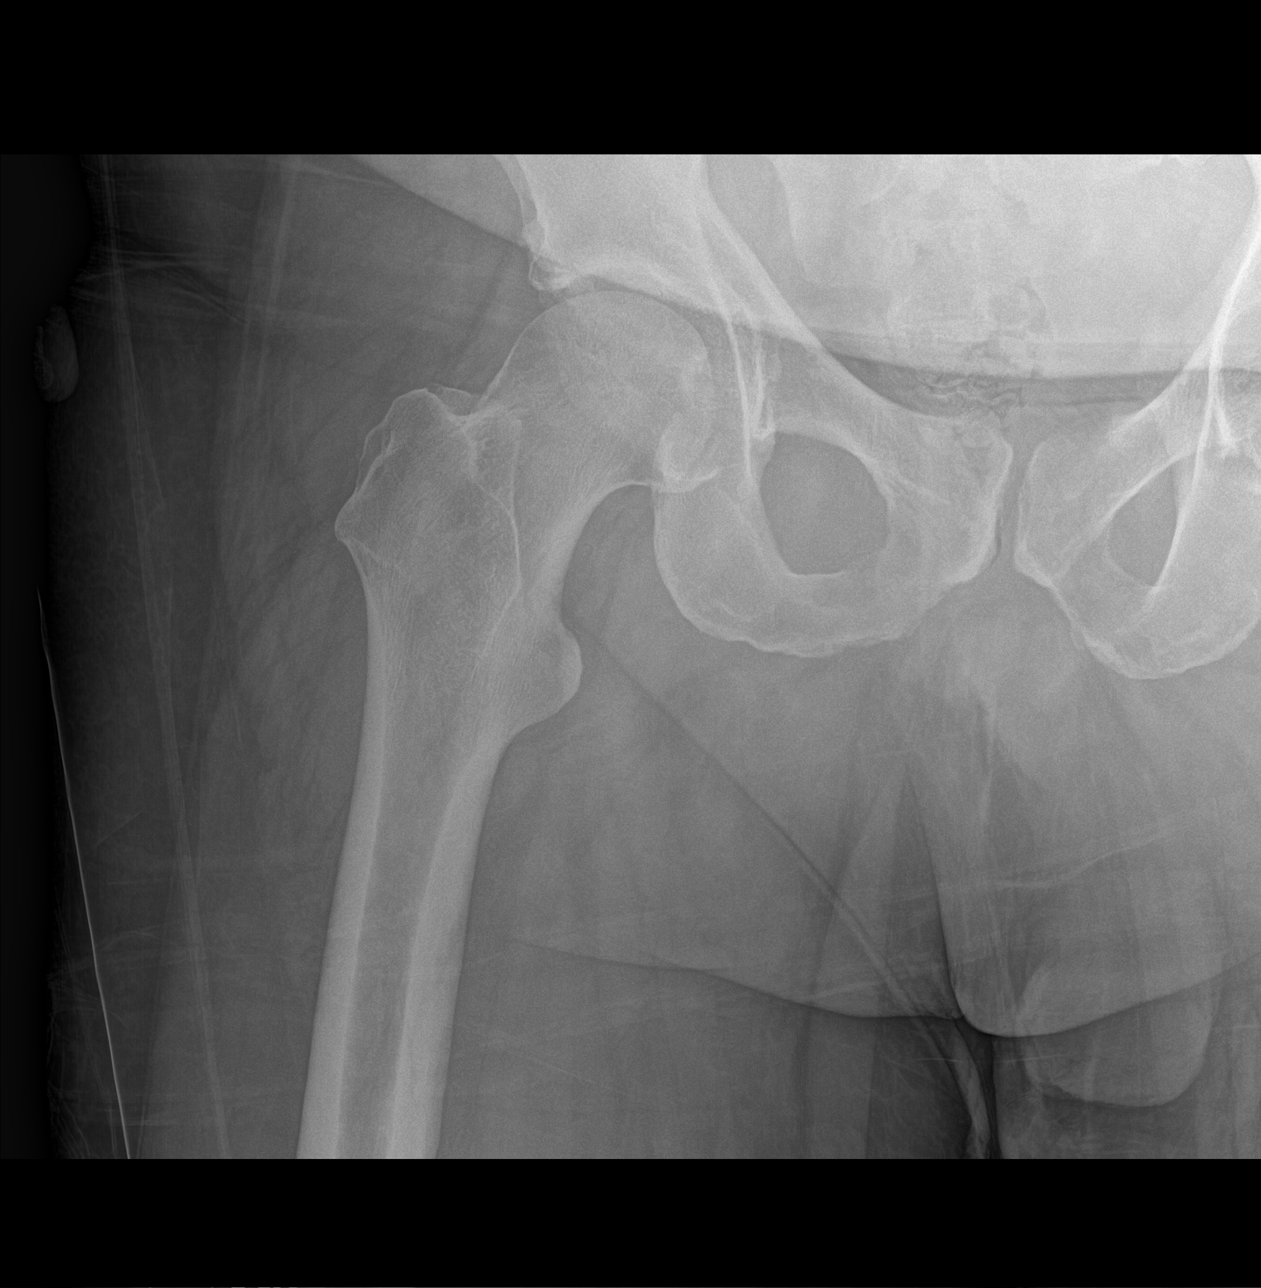

[pelvis ap]
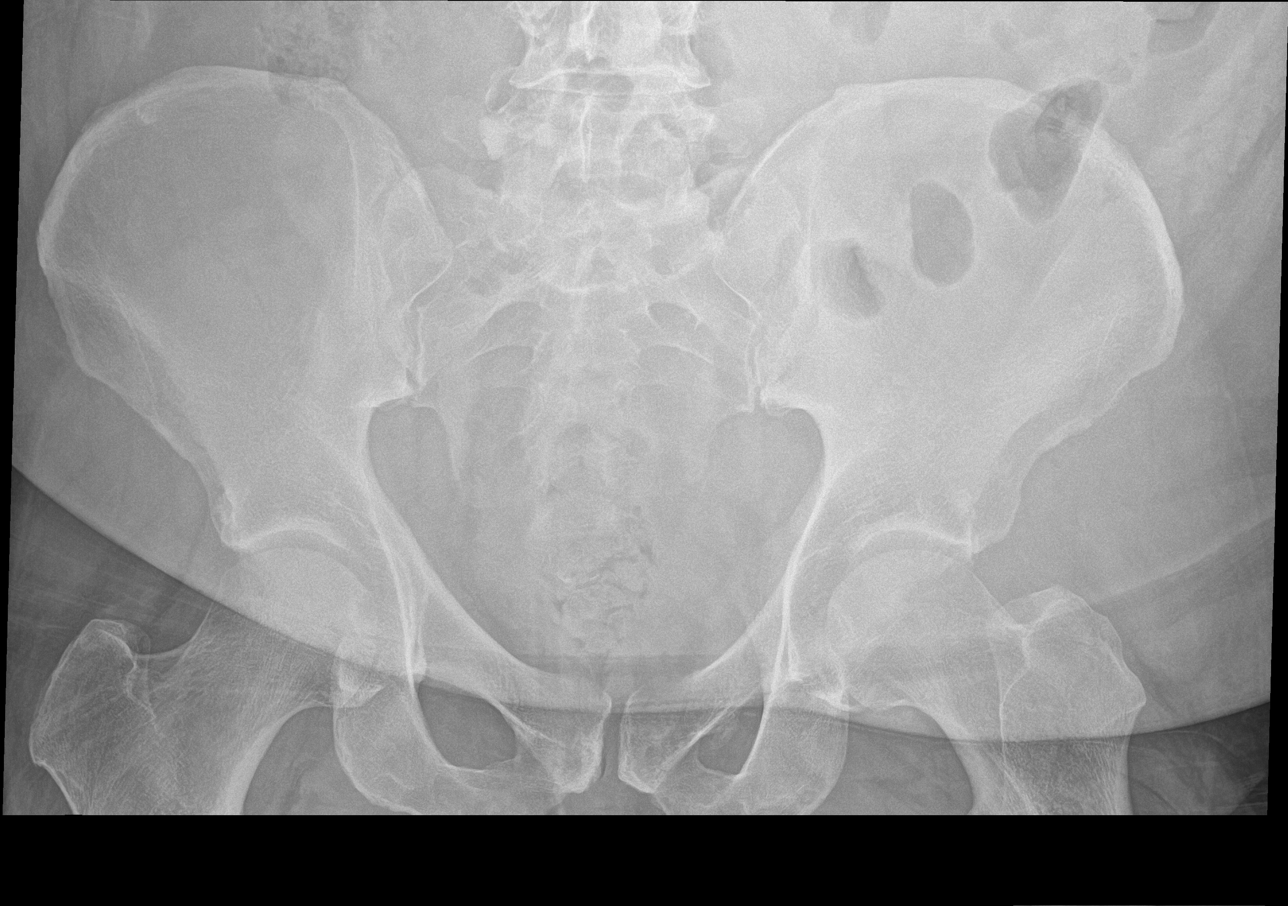

[hip ap (2 of 2)]
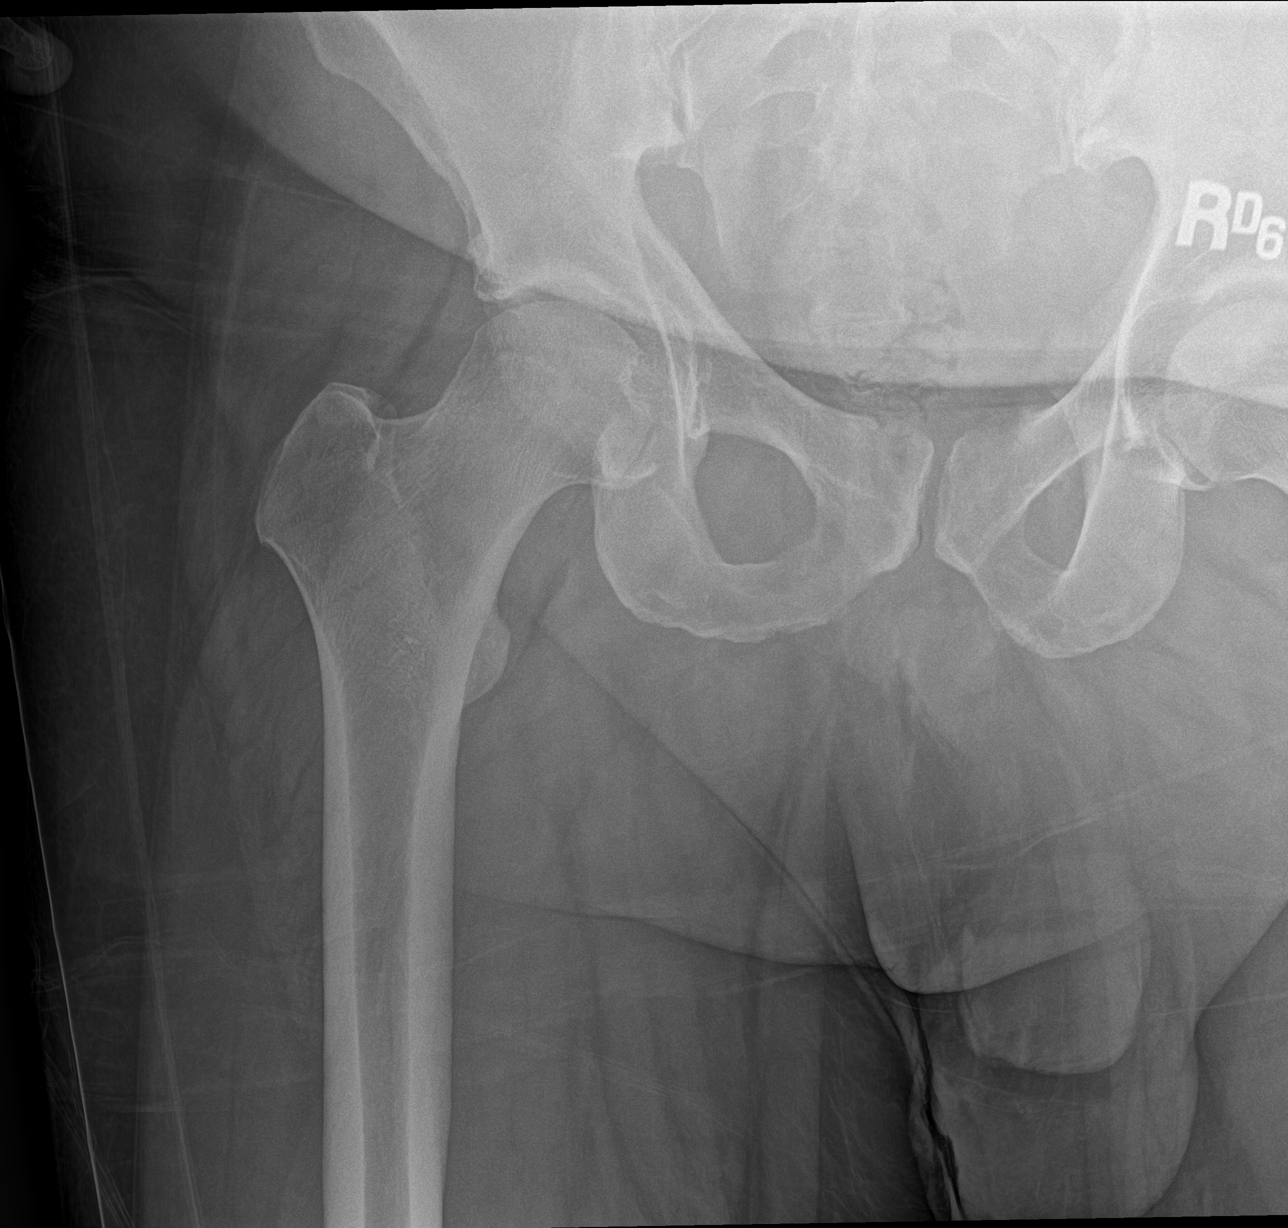

[3 of 3 positions shown; findings below may reference images not displayed]

FINDINGS: There is no evidence of hip fracture or dislocation. There is no
evidence of arthropathy or other focal bone abnormality.
IMPRESSION: Negative.

## 2024-01-25 IMAGING — CT CT HIP*R* W/CM
2 of 5 series · 16 of 46 positions shown, 19 images · IV contrast (agent unspecified)
Comparison: Radiograph performed earlier on the same date.

CLINICAL DATA: Hip pain, osteonecrosis suspected. X-ray done. Right
hip pain for 1 week.

EXAM:
CT OF THE LOWER RIGHT EXTREMITY WITH CONTRAST
TECHNIQUE: Multidetector CT imaging of the lower right extremity was performed
according to the standard protocol following intravenous contrast
administration.

[Series 5: 3 axial soft · axial · 0.57mm/px · z∈[+969,+1207]mm · 13 of 133 slices shown, 16 images]
[im 9/133  soft-tissue]
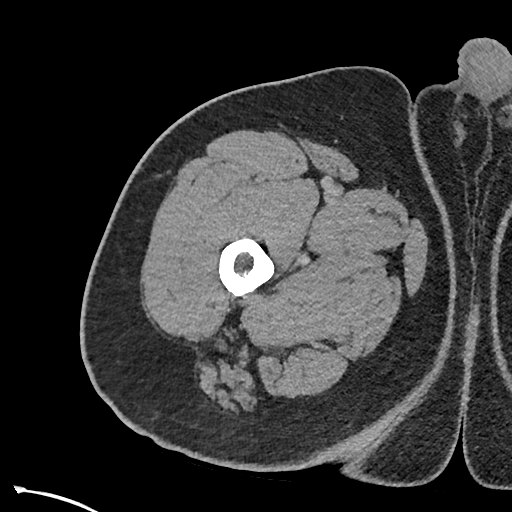
[im 9/133  bone]
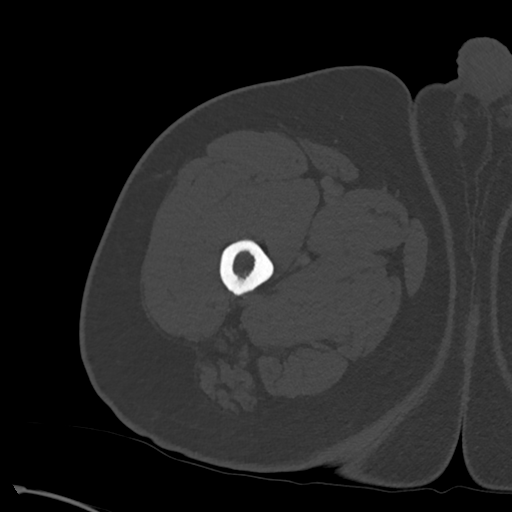
[im 22/133  soft-tissue]
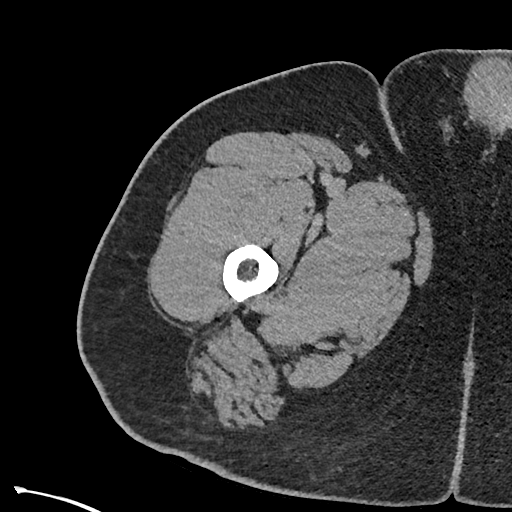
[im 35/133  soft-tissue]
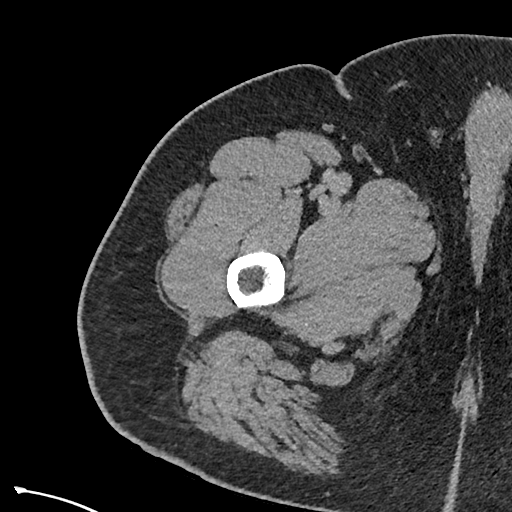
[im 47/133  soft-tissue]
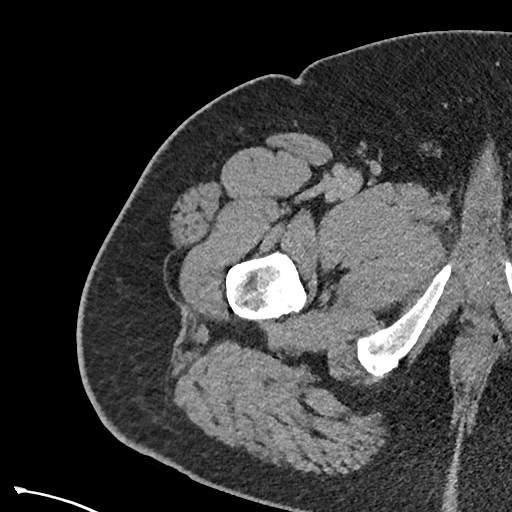
[im 60/133  soft-tissue]
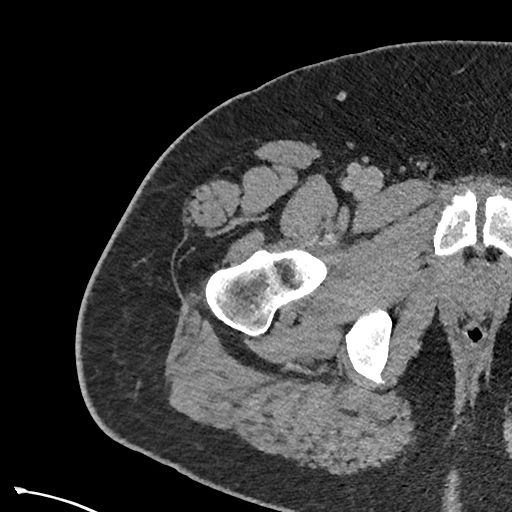
[im 73/133  soft-tissue]
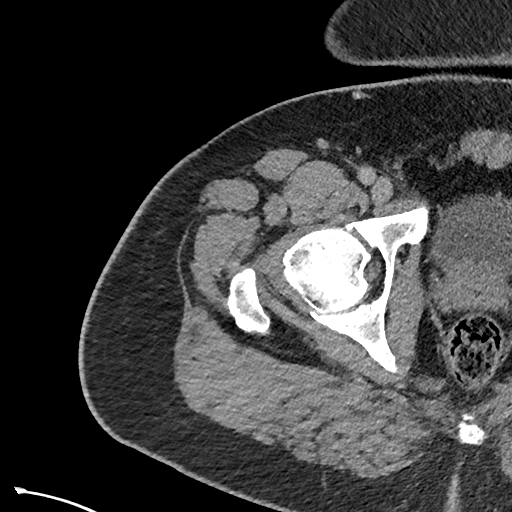
[im 86/133  soft-tissue]
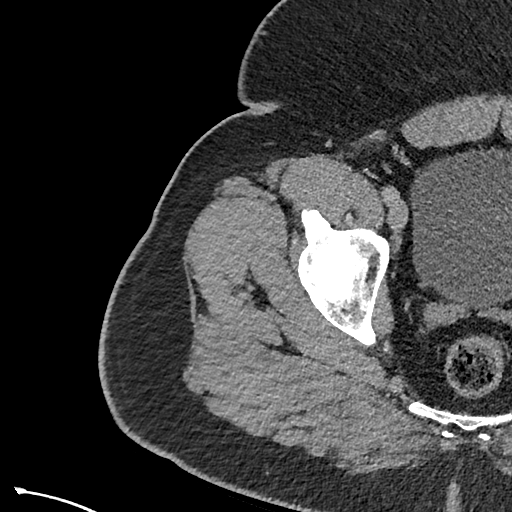
[im 98/133  soft-tissue]
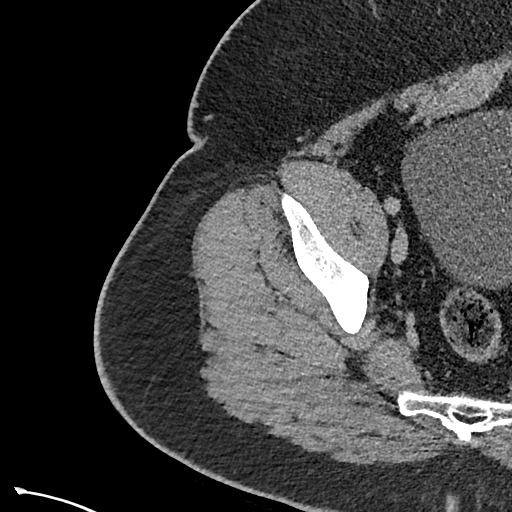
[im 111/133  soft-tissue]
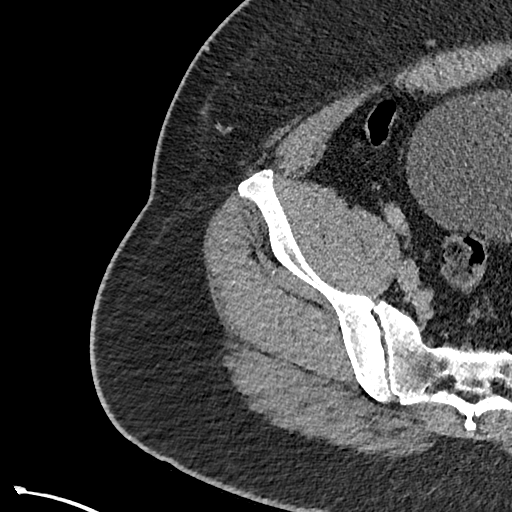
[im 111/133  bone]
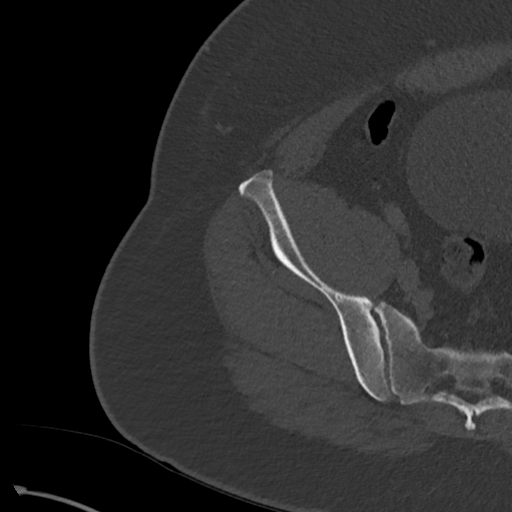
[im 115/133  lung]
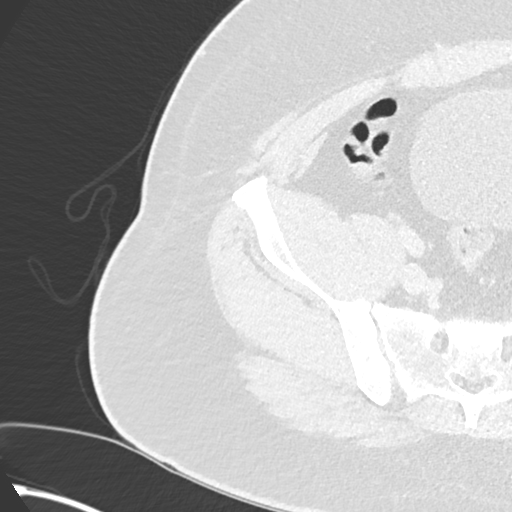
[im 120/133  lung]
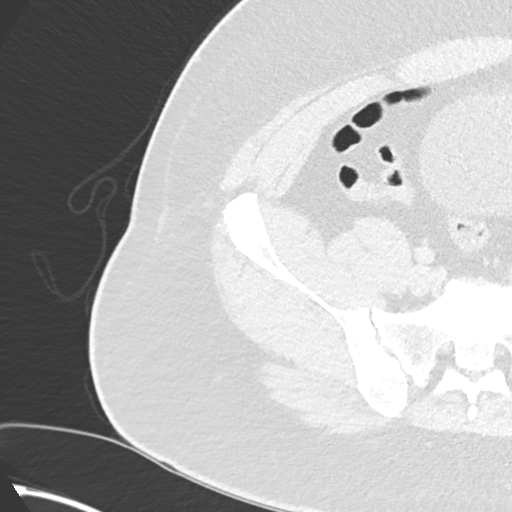
[im 124/133  soft-tissue]
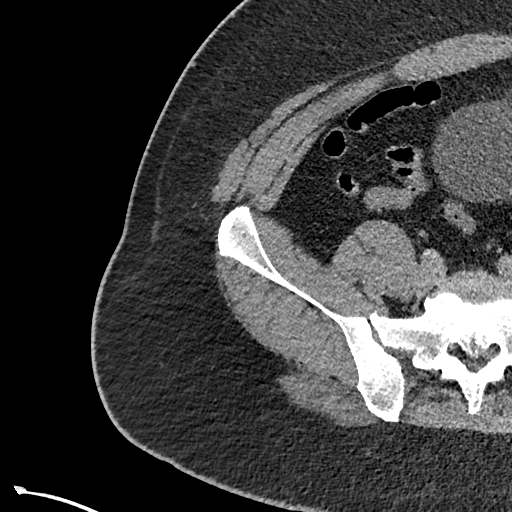
[im 124/133  lung]
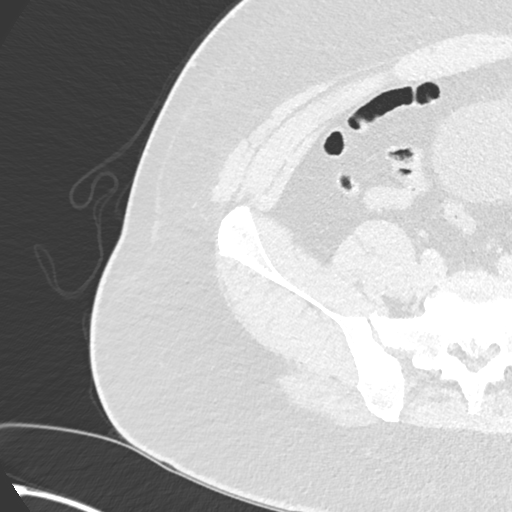
[im 128/133  lung]
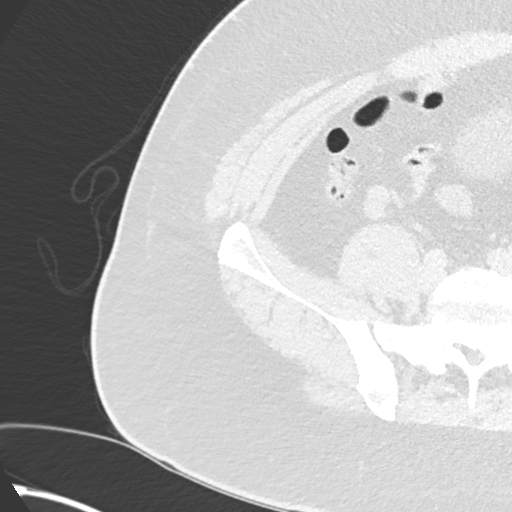

[Series 8: coronal st · coronal · 0.45mm/px · 3 of 144 slices shown]
[im 36/144  soft-tissue]
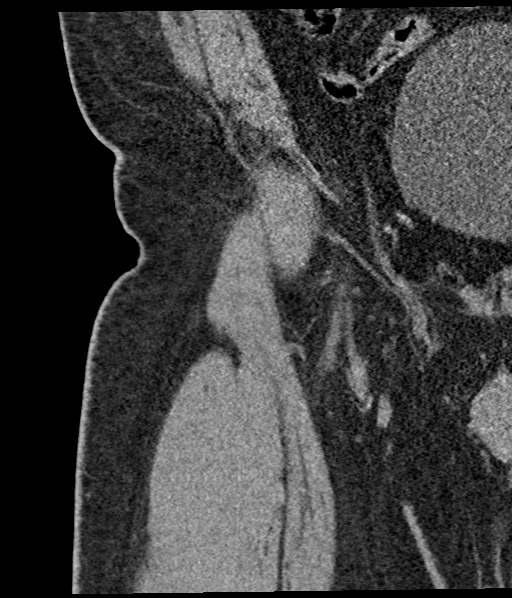
[im 72/144  soft-tissue]
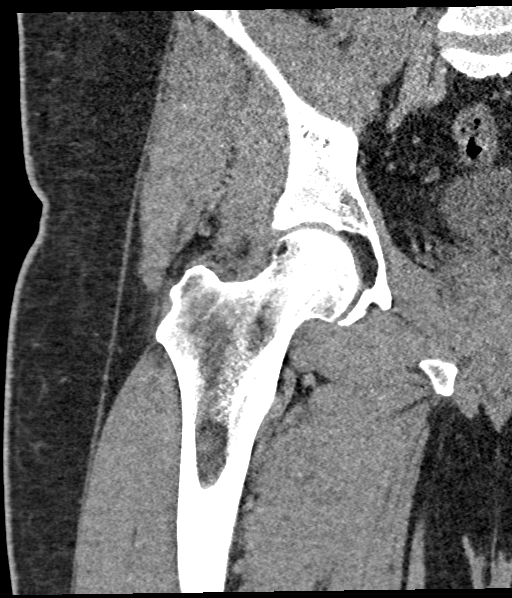
[im 108/144  soft-tissue]
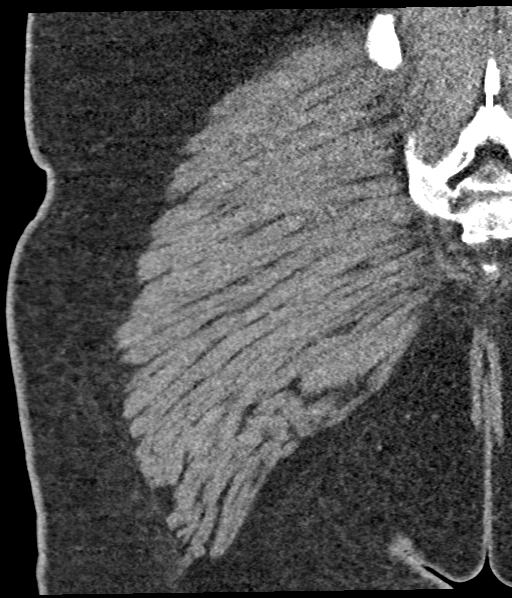

[16 of 46 positions shown; findings below may reference images not displayed]

RADIATION DOSE REDUCTION: This exam was performed according to the
departmental dose-optimization program which includes automated
exposure control, adjustment of the mA and/or kV according to
patient size and/or use of iterative reconstruction technique.

CONTRAST:  80mL OMNIPAQUE IOHEXOL 300 MG/ML  SOLN
FINDINGS: Bones/Joint/Cartilage

No fracture or dislocation. Normal alignment. No joint effusion. No
evidence of osteonecrosis.

Ligaments

Ligaments are suboptimally evaluated by CT.

Muscles and Tendons
Muscles are normal in bulk. No intramuscular hematoma or fluid
collection.

Soft tissue
No fluid collection or hematoma. No soft tissue mass. Skin and
subcutaneous soft tissues are within normal limits.
IMPRESSION: 1. No evidence of fracture, osteonecrosis or joint effusion. No
significant arthropathy.

2. Muscles and subcutaneous soft tissues are unremarkable. No
evidence of hematoma.
